# Patient Record
Sex: Female | Born: 1935
Health system: Southern US, Community
[De-identification: ages and names within clinical notes are randomized; demographics above are authoritative.]

## PROBLEM LIST (undated history)

## (undated) DIAGNOSIS — I10 Essential (primary) hypertension: Secondary | ICD-10-CM

## (undated) DIAGNOSIS — M8580 Other specified disorders of bone density and structure, unspecified site: Secondary | ICD-10-CM

## (undated) DIAGNOSIS — Z780 Asymptomatic menopausal state: Secondary | ICD-10-CM

## (undated) DIAGNOSIS — E785 Hyperlipidemia, unspecified: Secondary | ICD-10-CM

## (undated) DIAGNOSIS — Z83719 Family history of colon polyps, unspecified: Secondary | ICD-10-CM

## (undated) DIAGNOSIS — Z8371 Family history of colonic polyps: Secondary | ICD-10-CM

## (undated) HISTORY — PX: ABDOMINAL HYSTERECTOMY: SHX81

## (undated) HISTORY — DX: Asymptomatic menopausal state: Z78.0

## (undated) HISTORY — DX: Other specified disorders of bone density and structure, unspecified site: M85.80

## (undated) HISTORY — PX: CHOLECYSTECTOMY: SHX55

## (undated) HISTORY — DX: Family history of colonic polyps: Z83.71

## (undated) HISTORY — DX: Hyperlipidemia, unspecified: E78.5

## (undated) HISTORY — DX: Family history of colon polyps, unspecified: Z83.719

---

## 1998-02-15 ENCOUNTER — Ambulatory Visit (HOSPITAL_COMMUNITY): Admission: RE | Admit: 1998-02-15 | Discharge: 1998-02-15 | Payer: Self-pay

## 1999-03-14 ENCOUNTER — Ambulatory Visit (HOSPITAL_COMMUNITY): Admission: RE | Admit: 1999-03-14 | Discharge: 1999-03-14 | Payer: Self-pay | Admitting: *Deleted

## 2000-02-28 ENCOUNTER — Ambulatory Visit (HOSPITAL_COMMUNITY): Admission: RE | Admit: 2000-02-28 | Discharge: 2000-02-28 | Payer: Self-pay | Admitting: Sports Medicine

## 2001-05-07 ENCOUNTER — Encounter: Payer: Self-pay | Admitting: Internal Medicine

## 2001-05-07 ENCOUNTER — Ambulatory Visit (HOSPITAL_COMMUNITY): Admission: RE | Admit: 2001-05-07 | Discharge: 2001-05-07 | Payer: Self-pay | Admitting: Internal Medicine

## 2002-08-25 ENCOUNTER — Encounter: Payer: Self-pay | Admitting: Internal Medicine

## 2002-08-25 ENCOUNTER — Encounter: Admission: RE | Admit: 2002-08-25 | Discharge: 2002-08-25 | Payer: Self-pay | Admitting: Internal Medicine

## 2003-11-17 ENCOUNTER — Ambulatory Visit (HOSPITAL_COMMUNITY): Admission: RE | Admit: 2003-11-17 | Discharge: 2003-11-17 | Payer: Self-pay | Admitting: Internal Medicine

## 2003-11-30 ENCOUNTER — Encounter: Payer: Self-pay | Admitting: Internal Medicine

## 2003-12-30 ENCOUNTER — Emergency Department (HOSPITAL_COMMUNITY): Admission: EM | Admit: 2003-12-30 | Discharge: 2003-12-30 | Payer: Self-pay | Admitting: Emergency Medicine

## 2004-01-30 ENCOUNTER — Encounter (INDEPENDENT_AMBULATORY_CARE_PROVIDER_SITE_OTHER): Payer: Self-pay | Admitting: *Deleted

## 2004-01-30 ENCOUNTER — Observation Stay (HOSPITAL_COMMUNITY): Admission: RE | Admit: 2004-01-30 | Discharge: 2004-01-31 | Payer: Self-pay | Admitting: General Surgery

## 2004-05-02 ENCOUNTER — Ambulatory Visit: Payer: Self-pay | Admitting: Internal Medicine

## 2004-05-11 ENCOUNTER — Ambulatory Visit: Payer: Self-pay | Admitting: Internal Medicine

## 2004-07-02 ENCOUNTER — Ambulatory Visit: Payer: Self-pay | Admitting: Internal Medicine

## 2004-07-05 ENCOUNTER — Encounter: Admission: RE | Admit: 2004-07-05 | Discharge: 2004-07-05 | Payer: Self-pay | Admitting: Internal Medicine

## 2004-07-19 ENCOUNTER — Ambulatory Visit: Payer: Self-pay | Admitting: Internal Medicine

## 2004-07-20 ENCOUNTER — Ambulatory Visit: Payer: Self-pay | Admitting: Internal Medicine

## 2004-07-27 ENCOUNTER — Encounter: Admission: RE | Admit: 2004-07-27 | Discharge: 2004-07-27 | Payer: Self-pay | Admitting: Internal Medicine

## 2004-08-31 ENCOUNTER — Ambulatory Visit: Payer: Self-pay | Admitting: Internal Medicine

## 2004-09-05 ENCOUNTER — Ambulatory Visit: Payer: Self-pay | Admitting: Internal Medicine

## 2005-02-13 ENCOUNTER — Ambulatory Visit: Payer: Self-pay | Admitting: Internal Medicine

## 2005-05-10 ENCOUNTER — Ambulatory Visit (HOSPITAL_COMMUNITY): Admission: RE | Admit: 2005-05-10 | Discharge: 2005-05-10 | Payer: Self-pay | Admitting: Internal Medicine

## 2005-09-03 ENCOUNTER — Ambulatory Visit: Payer: Self-pay | Admitting: Internal Medicine

## 2005-09-06 ENCOUNTER — Ambulatory Visit: Payer: Self-pay | Admitting: Internal Medicine

## 2006-01-07 ENCOUNTER — Ambulatory Visit: Payer: Self-pay | Admitting: Internal Medicine

## 2006-01-23 ENCOUNTER — Encounter: Admission: RE | Admit: 2006-01-23 | Discharge: 2006-01-23 | Payer: Self-pay | Admitting: Surgery

## 2007-01-14 ENCOUNTER — Ambulatory Visit: Payer: Self-pay | Admitting: Internal Medicine

## 2007-01-14 DIAGNOSIS — D126 Benign neoplasm of colon, unspecified: Secondary | ICD-10-CM

## 2007-01-14 DIAGNOSIS — E785 Hyperlipidemia, unspecified: Secondary | ICD-10-CM

## 2007-01-14 DIAGNOSIS — E8941 Symptomatic postprocedural ovarian failure: Secondary | ICD-10-CM

## 2007-01-14 DIAGNOSIS — M8580 Other specified disorders of bone density and structure, unspecified site: Secondary | ICD-10-CM

## 2007-01-27 ENCOUNTER — Ambulatory Visit: Payer: Self-pay | Admitting: Internal Medicine

## 2007-01-29 LAB — CONVERTED CEMR LAB
ALT: 22 units/L (ref 0–35)
AST: 22 units/L (ref 0–37)
BUN: 11 mg/dL (ref 6–23)
Basophils Absolute: 0.1 10*3/uL (ref 0.0–0.1)
Basophils Relative: 1 % (ref 0.0–1.0)
CO2: 28 meq/L (ref 19–32)
Calcium: 9.2 mg/dL (ref 8.4–10.5)
Creatinine, Ser: 0.7 mg/dL (ref 0.4–1.2)
Hemoglobin: 13.6 g/dL (ref 12.0–15.0)
MCHC: 34.2 g/dL (ref 30.0–36.0)
Monocytes Absolute: 0.5 10*3/uL (ref 0.2–0.7)
Monocytes Relative: 6.7 % (ref 3.0–11.0)
Platelets: 271 10*3/uL (ref 150–400)
Potassium: 4.4 meq/L (ref 3.5–5.1)
RBC: 4.11 M/uL (ref 3.87–5.11)
RDW: 12.6 % (ref 11.5–14.6)
TSH: 1.86 microintl units/mL (ref 0.35–5.50)

## 2007-01-30 ENCOUNTER — Ambulatory Visit: Payer: Self-pay | Admitting: Internal Medicine

## 2007-01-30 ENCOUNTER — Encounter: Payer: Self-pay | Admitting: Internal Medicine

## 2007-02-02 ENCOUNTER — Encounter: Admission: RE | Admit: 2007-02-02 | Discharge: 2007-02-02 | Payer: Self-pay | Admitting: Internal Medicine

## 2007-02-11 ENCOUNTER — Encounter (INDEPENDENT_AMBULATORY_CARE_PROVIDER_SITE_OTHER): Payer: Self-pay | Admitting: *Deleted

## 2007-03-10 ENCOUNTER — Telehealth: Payer: Self-pay | Admitting: Internal Medicine

## 2007-06-05 ENCOUNTER — Ambulatory Visit: Payer: Self-pay | Admitting: Internal Medicine

## 2007-07-07 ENCOUNTER — Encounter: Payer: Self-pay | Admitting: Internal Medicine

## 2007-07-07 ENCOUNTER — Ambulatory Visit: Payer: Self-pay | Admitting: Internal Medicine

## 2007-07-07 ENCOUNTER — Other Ambulatory Visit: Admission: RE | Admit: 2007-07-07 | Discharge: 2007-07-07 | Payer: Self-pay | Admitting: Internal Medicine

## 2007-07-09 ENCOUNTER — Ambulatory Visit: Payer: Self-pay | Admitting: Internal Medicine

## 2007-07-09 ENCOUNTER — Telehealth (INDEPENDENT_AMBULATORY_CARE_PROVIDER_SITE_OTHER): Payer: Self-pay | Admitting: *Deleted

## 2007-07-17 ENCOUNTER — Encounter: Payer: Self-pay | Admitting: Internal Medicine

## 2007-07-17 LAB — CONVERTED CEMR LAB
Pap Smear: NORMAL
Total CHOL/HDL Ratio: 2.8
Triglycerides: 88 mg/dL (ref 0–149)

## 2007-12-09 ENCOUNTER — Ambulatory Visit: Payer: Self-pay | Admitting: Internal Medicine

## 2008-02-15 ENCOUNTER — Ambulatory Visit: Payer: Self-pay | Admitting: Internal Medicine

## 2008-02-15 DIAGNOSIS — M549 Dorsalgia, unspecified: Secondary | ICD-10-CM | POA: Insufficient documentation

## 2008-02-17 ENCOUNTER — Ambulatory Visit: Payer: Self-pay | Admitting: Internal Medicine

## 2008-02-19 ENCOUNTER — Telehealth (INDEPENDENT_AMBULATORY_CARE_PROVIDER_SITE_OTHER): Payer: Self-pay | Admitting: *Deleted

## 2008-02-19 LAB — CONVERTED CEMR LAB
AST: 25 units/L (ref 0–37)
Albumin: 3.7 g/dL (ref 3.5–5.2)
Bilirubin, Direct: 0.1 mg/dL (ref 0.0–0.3)
CO2: 28 meq/L (ref 19–32)
Chloride: 101 meq/L (ref 96–112)
Creatinine, Ser: 0.9 mg/dL (ref 0.4–1.2)
Glucose, Bld: 83 mg/dL (ref 70–99)
Sodium: 136 meq/L (ref 135–145)
Total Bilirubin: 0.8 mg/dL (ref 0.3–1.2)

## 2008-04-28 ENCOUNTER — Ambulatory Visit: Payer: Self-pay | Admitting: Diagnostic Radiology

## 2008-04-28 ENCOUNTER — Ambulatory Visit (HOSPITAL_BASED_OUTPATIENT_CLINIC_OR_DEPARTMENT_OTHER): Admission: RE | Admit: 2008-04-28 | Discharge: 2008-04-28 | Payer: Self-pay | Admitting: Internal Medicine

## 2008-05-03 ENCOUNTER — Encounter (INDEPENDENT_AMBULATORY_CARE_PROVIDER_SITE_OTHER): Payer: Self-pay | Admitting: *Deleted

## 2008-10-25 ENCOUNTER — Encounter (INDEPENDENT_AMBULATORY_CARE_PROVIDER_SITE_OTHER): Payer: Self-pay | Admitting: *Deleted

## 2008-10-27 ENCOUNTER — Telehealth: Payer: Self-pay | Admitting: Internal Medicine

## 2008-11-08 ENCOUNTER — Encounter: Payer: Self-pay | Admitting: Internal Medicine

## 2009-02-27 ENCOUNTER — Ambulatory Visit: Payer: Self-pay | Admitting: Internal Medicine

## 2009-03-03 ENCOUNTER — Encounter (INDEPENDENT_AMBULATORY_CARE_PROVIDER_SITE_OTHER): Payer: Self-pay | Admitting: *Deleted

## 2009-03-03 ENCOUNTER — Encounter: Payer: Self-pay | Admitting: Internal Medicine

## 2009-03-03 LAB — CONVERTED CEMR LAB
ALT: 22 units/L (ref 0–35)
AST: 24 units/L (ref 0–37)
Basophils Relative: 0.1 % (ref 0.0–3.0)
CO2: 32 meq/L (ref 19–32)
Calcium: 9 mg/dL (ref 8.4–10.5)
Cholesterol: 181 mg/dL (ref 0–200)
Creatinine, Ser: 0.6 mg/dL (ref 0.4–1.2)
GFR calc non Af Amer: 103.04 mL/min (ref >60)
HDL: 68.3 mg/dL (ref >39.00)
Hemoglobin: 13.2 g/dL (ref 12.0–15.0)
LDL Cholesterol: 98 mg/dL (ref 0–99)
Lymphocytes Relative: 36.4 % (ref 12.0–46.0)
Monocytes Relative: 7.7 % (ref 3.0–12.0)
Neutro Abs: 4.3 10*3/uL (ref 1.4–7.7)
Neutrophils Relative %: 52.6 % (ref 43.0–77.0)
RBC: 4 M/uL (ref 3.87–5.11)
Sodium: 141 meq/L (ref 135–145)
Total CHOL/HDL Ratio: 3
Triglycerides: 73 mg/dL (ref 0.0–149.0)
WBC: 8.2 10*3/uL (ref 4.5–10.5)

## 2009-04-10 ENCOUNTER — Telehealth: Payer: Self-pay | Admitting: Internal Medicine

## 2009-08-31 ENCOUNTER — Telehealth (INDEPENDENT_AMBULATORY_CARE_PROVIDER_SITE_OTHER): Payer: Self-pay | Admitting: *Deleted

## 2009-10-27 ENCOUNTER — Telehealth (INDEPENDENT_AMBULATORY_CARE_PROVIDER_SITE_OTHER): Payer: Self-pay | Admitting: *Deleted

## 2009-10-31 ENCOUNTER — Ambulatory Visit: Payer: Self-pay | Admitting: Internal Medicine

## 2009-10-31 ENCOUNTER — Inpatient Hospital Stay (HOSPITAL_COMMUNITY): Admission: AD | Admit: 2009-10-31 | Discharge: 2009-11-02 | Payer: Self-pay | Admitting: Cardiology

## 2009-10-31 ENCOUNTER — Ambulatory Visit: Payer: Self-pay | Admitting: Cardiology

## 2009-11-03 ENCOUNTER — Telehealth: Payer: Self-pay | Admitting: Internal Medicine

## 2009-11-10 ENCOUNTER — Ambulatory Visit: Payer: Self-pay | Admitting: Internal Medicine

## 2009-11-10 DIAGNOSIS — F411 Generalized anxiety disorder: Secondary | ICD-10-CM

## 2009-11-10 DIAGNOSIS — R0902 Hypoxemia: Secondary | ICD-10-CM | POA: Insufficient documentation

## 2009-11-13 ENCOUNTER — Ambulatory Visit: Payer: Self-pay | Admitting: Cardiology

## 2009-11-13 DIAGNOSIS — I10 Essential (primary) hypertension: Secondary | ICD-10-CM

## 2009-11-13 DIAGNOSIS — G4730 Sleep apnea, unspecified: Secondary | ICD-10-CM | POA: Insufficient documentation

## 2009-12-05 ENCOUNTER — Ambulatory Visit: Payer: Self-pay | Admitting: Internal Medicine

## 2009-12-11 LAB — CONVERTED CEMR LAB
AST: 19 units/L (ref 0–37)
HDL: 57.4 mg/dL (ref >39.00)
LDL Cholesterol: 103 mg/dL — ABNORMAL HIGH (ref 0–99)
Total CHOL/HDL Ratio: 3
Triglycerides: 78 mg/dL (ref 0.0–149.0)

## 2009-12-13 ENCOUNTER — Encounter: Payer: Self-pay | Admitting: Internal Medicine

## 2009-12-14 ENCOUNTER — Encounter: Payer: Self-pay | Admitting: Internal Medicine

## 2009-12-27 ENCOUNTER — Encounter: Payer: Self-pay | Admitting: Internal Medicine

## 2010-01-05 ENCOUNTER — Encounter: Payer: Self-pay | Admitting: Internal Medicine

## 2010-01-05 ENCOUNTER — Ambulatory Visit: Payer: Self-pay | Admitting: Internal Medicine

## 2010-01-08 ENCOUNTER — Ambulatory Visit: Payer: Self-pay | Admitting: Internal Medicine

## 2010-01-08 ENCOUNTER — Encounter (INDEPENDENT_AMBULATORY_CARE_PROVIDER_SITE_OTHER): Payer: Self-pay | Admitting: *Deleted

## 2010-01-10 ENCOUNTER — Ambulatory Visit: Payer: Self-pay | Admitting: Radiology

## 2010-01-10 ENCOUNTER — Ambulatory Visit (HOSPITAL_BASED_OUTPATIENT_CLINIC_OR_DEPARTMENT_OTHER): Admission: RE | Admit: 2010-01-10 | Discharge: 2010-01-10 | Payer: Self-pay | Admitting: Internal Medicine

## 2010-01-11 ENCOUNTER — Telehealth: Payer: Self-pay | Admitting: Internal Medicine

## 2010-01-16 ENCOUNTER — Ambulatory Visit: Payer: Self-pay | Admitting: Pulmonary Disease

## 2010-01-17 ENCOUNTER — Encounter: Payer: Self-pay | Admitting: Internal Medicine

## 2010-02-14 ENCOUNTER — Encounter (INDEPENDENT_AMBULATORY_CARE_PROVIDER_SITE_OTHER): Payer: Self-pay | Admitting: *Deleted

## 2010-02-15 ENCOUNTER — Ambulatory Visit: Payer: Self-pay | Admitting: Gastroenterology

## 2010-02-23 ENCOUNTER — Encounter: Payer: Self-pay | Admitting: Gastroenterology

## 2010-02-27 ENCOUNTER — Ambulatory Visit: Payer: Self-pay | Admitting: Internal Medicine

## 2010-02-27 DIAGNOSIS — R05 Cough: Secondary | ICD-10-CM

## 2010-02-28 ENCOUNTER — Ambulatory Visit: Payer: Self-pay | Admitting: Gastroenterology

## 2010-03-01 ENCOUNTER — Telehealth: Payer: Self-pay | Admitting: Internal Medicine

## 2010-03-06 ENCOUNTER — Encounter: Payer: Self-pay | Admitting: Internal Medicine

## 2010-03-06 ENCOUNTER — Telehealth: Payer: Self-pay | Admitting: Internal Medicine

## 2010-03-06 ENCOUNTER — Ambulatory Visit: Payer: Self-pay | Admitting: Internal Medicine

## 2010-03-06 LAB — CONVERTED CEMR LAB
ALT: 13 units/L (ref 0–35)
BUN: 13 mg/dL (ref 6–23)
Calcium: 9.5 mg/dL (ref 8.4–10.5)
GFR calc non Af Amer: 95.39 mL/min (ref >60.00)
Glucose, Bld: 106 mg/dL — ABNORMAL HIGH (ref 70–99)
Potassium: 4.2 meq/L (ref 3.5–5.1)
Sodium: 136 meq/L (ref 135–145)

## 2010-03-08 LAB — CONVERTED CEMR LAB: Vit D, 25-Hydroxy: 37 ng/mL (ref 30–89)

## 2010-03-14 ENCOUNTER — Telehealth: Payer: Self-pay | Admitting: Internal Medicine

## 2010-04-12 ENCOUNTER — Encounter: Payer: Self-pay | Admitting: Gastroenterology

## 2010-04-17 NOTE — Progress Notes (Signed)
Summary: Schedule Colonoscopy  Phone Note Outgoing Call   Call placed by: Hortense Ramal CMA Duncan Dull),  April 10, 2009 12:06 PM Call placed to: Patient Summary of Call: I have called patient to advise her that it is time for her recall colonoscopy due to her history of adenomatous colonic polyps. Patient states that she got the letter but is uninterested in having a colonoscopy at this time. Initial call taken by: Hortense Ramal CMA Duncan Dull),  April 10, 2009 12:07 PM

## 2010-04-17 NOTE — Assessment & Plan Note (Signed)
Summary: PAIN IN FEET/NEEDS REFERRAL/KN   Vital Signs:  Patient profile:   77 year old female Weight:      152.13 pounds Pulse rate:   71 / minute Pulse rhythm:   regular BP sitting:   126 / 86  (left arm) Cuff size:   large  Vitals Entered By: Army Fossa CMA (January 08, 2010 3:08 PM) CC: Pt here c/o pain in the tops of both feet.  Comments getting worse overtime. stopped all meds today- feels it has something to do with pain. pharm- [facility]   History of Present Illness: L>R foot pain; pain  located at the dorsum of the food, much worse with walking and putting pressure on it. she is holding lipitor and  Toprol for the last 2 days. No difference in the pain  so far.  ROS No actual injury Note exercising more, rather she has been more sedentary No pain in the heels No swelling in that area No other joints affected  Current Medications (verified): 1)  Baby Aspirin 81 Mg  Chew (Aspirin) .Marland Kitchen.. 1 By Mouth Qd 2)  Cod Liver Oil  Oil (Cod Liver Oil) .... Take 1 Capsule By Mouth Once A Day 3)  Flax Seed Oil 1000 Mg Caps (Flaxseed (Linseed)) .... Take 1 Capsule By Mouth Once A Day 4)  Vitamin D 1000 Unit Tabs (Cholecalciferol) .... Take 1 Tablet By Mouth Once A Day 5)  Lipitor 20 Mg Tabs (Atorvastatin Calcium) .... Take One Tablet By Mouth Daily. 6)  Metoprolol Succinate 25 Mg Xr24h-Tab (Metoprolol Succinate) .... Take One Tablet By Mouth Daily  Allergies (verified): 1)  ! * Vytorin  Physical Exam  General:  alert, well-developed, and well-nourished.   Pulses:  good bilateral pedal pulses Extremities:  no peripheral edema, calves symmetric and nontender Palpation of the dorsum of the left foot slightly warm, tender and  puffy but no red, no deformity  Dorsum of the right foot   essentially normal except for some tenderness to palpation Ankle: No edema, redness or decreased range of motion on either side. Toes with good capillary refill and no swelling Psych:  not anxious  appearing and not depressed appearing.     Impression & Recommendations:  Problem # 1:  FOOT PAIN, BILATERAL (ICD-729.5) presented w/  pain bilaterally, worse on the left. There is no indication of podagra- gout  at this point Stress fracture? plan: Rest, x-rays, pain control with Tylenol, okay to hold lipitor for 2 weeks although is unlikely to be the culprit. Continue with Toprol  Orders: T-Foot Left Min 3 Views (73630TC) T-Foot Right (73630TC)  Complete Medication List: 1)  Baby Aspirin 81 Mg Chew (Aspirin) .Marland Kitchen.. 1 by mouth qd 2)  Cod Liver Oil Oil (Cod liver oil) .... Take 1 capsule by mouth once a day 3)  Flax Seed Oil 1000 Mg Caps (Flaxseed (linseed)) .... Take 1 capsule by mouth once a day 4)  Vitamin D 1000 Unit Tabs (Cholecalciferol) .... Take 1 tablet by mouth once a day 5)  Lipitor 20 Mg Tabs (Atorvastatin calcium) .... Take one tablet by mouth daily. 6)  Metoprolol Succinate 25 Mg Xr24h-tab (Metoprolol succinate) .... Take one tablet by mouth daily  Patient Instructions: 1)  hold Lipitor for 2 weeks 2)  Tylenol 500 mg over-the-counter, one or 2 tablets every 6 hours as needed for pain 3)  call in one week and let me know if you are not better   Orders Added: 1)  T-Foot Left Min  3 Views [73630TC] 2)  T-Foot Right [73630TC] 3)  Est. Patient Level III [04540]

## 2010-04-17 NOTE — Miscellaneous (Signed)
Summary: Order for Oxygen/[facility]  Order for Oxygen/[facility]   Imported By: Lanelle Bal 01/24/2010 08:46:19  _____________________________________________________________________  External Attachment:    Type:   Image     Comment:   External Document

## 2010-04-17 NOTE — Letter (Signed)
Summary: [facility] Instructions  [facility]  [address] [place], [place] [address]   Phone: [phone]  Fax: [phone]       Alicia Joseph    10-18-1929    MRN: [id number]        Procedure Day Alicia Joseph:  [facility]  02/28/10     Arrival Time:  9:00AM     Procedure Time:  10:00AM     Location of Procedure:                    Alicia Joseph _  [facility] (4th Floor)                      PREPARATION FOR COLONOSCOPY WITH MOVIPREP   Starting 5 days prior to your procedure 02/23/10 do not eat nuts, seeds, popcorn, corn, beans, peas,  salads, or any raw vegetables.  Do not take any fiber supplements (e.g. Metamucil, Citrucel, and Benefiber).  THE DAY BEFORE YOUR PROCEDURE         DATE: 02/27/10  DAY: TUESDAY  1.  Drink clear liquids the entire day-NO SOLID FOOD  2.  Do not drink anything colored red or purple.  Avoid juices with pulp.  No orange juice.  3.  Drink at least 64 oz. (8 glasses) of fluid/clear liquids during the day to prevent dehydration and help the prep work efficiently.  CLEAR LIQUIDS INCLUDE: Water Jello Ice Popsicles Tea (sugar ok, no milk/cream) Powdered fruit flavored drinks Coffee (sugar ok, no milk/cream) Gatorade Juice: apple, white grape, white cranberry  Lemonade Clear bullion, consomm, broth Carbonated beverages (any kind) Strained chicken noodle soup Hard Candy                             4.  In the morning, mix first dose of MoviPrep solution:    Empty 1 Pouch A and 1 Pouch B into the disposable container    Add lukewarm drinking water to the top line of the container. Mix to dissolve    Refrigerate (mixed solution should be used within 24 hrs)  5.  Begin drinking the prep at 5:00 p.m. The MoviPrep container is divided by 4 marks.   Every 15 minutes drink the solution down to the next mark (approximately 8 oz) until the full liter is complete.   6.  Follow completed prep with 16 oz of clear liquid of your choice (Nothing  red or purple).  Continue to drink clear liquids until bedtime.  7.  Before going to bed, mix second dose of MoviPrep solution:    Empty 1 Pouch A and 1 Pouch B into the disposable container    Add lukewarm drinking water to the top line of the container. Mix to dissolve    Refrigerate  THE DAY OF YOUR PROCEDURE      DATE: 02/28/10  DAY: WEDNESDAY  Beginning at 5:00AM (5 hours before procedure):         1. Every 15 minutes, drink the solution down to the next mark (approx 8 oz) until the full liter is complete.  2. Follow completed prep with 16 oz. of clear liquid of your choice.    3. You may drink clear liquids until 8:00AM (2 HOURS BEFORE PROCEDURE).   MEDICATION INSTRUCTIONS  Unless otherwise instructed, you should take regular prescription medications with a small sip of water   as early as possible the morning of your  procedure.         OTHER INSTRUCTIONS  You will need a responsible adult at least 75 years of age to accompany you and drive you home.   This person must remain in the waiting room during your procedure.  Wear loose fitting clothing that is easily removed.  Leave jewelry and other valuables at home.  However, you may wish to bring a book to read or  an iPod/MP3 player to listen to music as you wait for your procedure to start.  Remove all body piercing jewelry and leave at home.  Total time from sign-in until discharge is approximately 2-3 hours.  You should go home directly after your procedure and rest.  You can resume normal activities the  day after your procedure.  The day of your procedure you should not:   Drive   Make legal decisions   Operate machinery   Drink alcohol   Return to work  You will receive specific instructions about eating, activities and medications before you leave.    The above instructions have been reviewed and explained to me by   Alicia Almas RN  February 15, 2010 3:07 PM     I fully understand  and can verbalize these instructions _____________________________ Date _________

## 2010-04-17 NOTE — Assessment & Plan Note (Signed)
Summary: Alicia   Visit Type:  Post-hospital  CC:  No complains.  History of Present Illness: Alicia Joseph was the first time she felt like herself.  SHe has not had any more chest pain.  She was Dr. Drue Novel on Friday.    Current Medications (verified): 1)  Baby Aspirin 81 Mg  Chew (Aspirin) .Marland Kitchen.. 1 By Mouth Qd 2)  Cod Liver Oil  Oil (Cod Liver Oil) .... Take 1 Capsule By Mouth Once A Day 3)  Flax Seed Oil 1000 Mg Caps (Flaxseed (Linseed)) .... Take 1 Capsule By Mouth Once A Day 4)  Vitamin D 1000 Unit Tabs (Cholecalciferol) .... Take 1 Tablet By Mouth Once A Day 5)  Lipitor 20 Mg Tabs (Atorvastatin Calcium) .... Take One Tablet By Mouth Daily.  Allergies: 1)  ! * Vytorin  Vital Signs:  Patient profile:   77 year old female Height:      64 inches Weight:      151.75 pounds BMI:     26.14 Pulse rate:   74 / minute Pulse rhythm:   regular Resp:     18 per minute BP sitting:   170 / 80  (left arm) Cuff size:   large  Vitals Entered By: Vikki Ports (November 13, 2009 3:30 PM)  Physical Exam  General:  Well developed, well nourished, in no acute distress. Head:  normocephalic and atraumatic Eyes:  PERRLA/EOM intact; conjunctiva and lids normal. Lungs:  Clear bilaterally to auscultation and percussion. Heart:  PMI non displaced.  Normal S1 and S2.  No murmur.  Extremities:  No clubbing or cyanosis. Neurologic:  Alert and oriented x 3.   EKG  Procedure date:  11/13/2009  Findings:      NSR.  WNL.  Impression & Recommendations:  Problem # 1:  CHEST PAIN (ICD-786.50) Resolved.  No further episodes.  Did relatively well on study, and I have encouraged her to walk for two weeks before undertaking any further activity such as tennis, and getting approval from Dr. Drue Novel before doing so.   Her updated medication list for this problem includes:    Baby Aspirin 81 Mg Chew (Aspirin) .Marland Kitchen... 1 by mouth qd    Metoprolol Succinate 25 Mg Xr24h-tab (Metoprolol succinate) .Marland Kitchen... Take one tablet by  mouth daily  Orders: EKG w/ Interpretation (93000)  Problem # 2:  HYPERTENSION, BENIGN (ICD-401.1) a bit episodic, but she needs to get a log of BP at home, and then take that to Dr. Drue Novel.  If they hover in the higher range, then some initiation of therapy would be helpful. Her updated medication list for this problem includes:    Baby Aspirin 81 Mg Chew (Aspirin) .Marland Kitchen... 1 by mouth qd    Metoprolol Succinate 25 Mg Xr24h-tab (Metoprolol succinate) .Marland Kitchen... Take one tablet by mouth daily  Problem # 3:  UNSPECIFIED SLEEP APNEA (ICD-780.57) She develop some hypoxemia in hospital at night, it was brought to her attention.  Would benefit from sleep assessment.  Will defer to Dr. Drue Novel.    Patient Instructions: 1)  Your physician recommends that you schedule a follow-up appointment as needed with Dr Riley Kill.  Please schedule an appointment with Dr Drue Novel in 4 WEEKS. 2)  Your physician has requested that you regularly monitor and record your blood pressure and pulse readings at home.  Please use the same machine at the same time of day to check your readings and record them to bring to your follow-up visit.  Please call our office in  a few weeks with your BP and pulse readings.  3)  Your physician has recommended you make the following change in your medication: START Metoprolol Succinate 25mg  once a day  Prescriptions: METOPROLOL SUCCINATE 25 MG XR24H-TAB (METOPROLOL SUCCINATE) Take one tablet by mouth daily  #90 x 2   Entered by:   Julieta Gutting, RN, BSN   Authorized by:   Ronaldo Miyamoto, MD, [facility]   Signed by:   Julieta Gutting, RN, BSN on 11/13/2009   Method used:   Electronically to        [facility] [id number] (retail)       [address] [place], [place]  [address]       Ph: [phone]       Fax: [phone]   RxID:   [id number]

## 2010-04-17 NOTE — Procedures (Signed)
Summary: Oximetry/[facility]  Oximetry/[facility]   Imported By: Lanelle Bal 01/10/2010 09:21:54  _____________________________________________________________________  External Attachment:    Type:   Image     Comment:   External Document

## 2010-04-17 NOTE — Progress Notes (Signed)
Summary: Xray Reports  Phone Note Call from Patient Call back at Home Phone [phone]   Caller: Patient Summary of Call: Pt would like to know the results from her Xray Reports.  Initial call taken by: Army Fossa CMA,  January 11, 2010 4:13 PM  Follow-up for Phone Call        x-rays negative If pain persists---> rest, leg elevation and arrange a orthopedic surgery consultation Follow-up by: [facility] E. Paz MD,  January 12, 2010 10:06 AM  Additional Follow-up for Phone Call Additional follow up Details #1::        I spoke with pt she is aware.  Additional Follow-up by: Army Fossa CMA,  January 12, 2010 11:14 AM

## 2010-04-17 NOTE — Letter (Signed)
Summary: Pre Visit Letter Revised  [facility]  [address] [place], [place] [address]   Phone: [phone]  Fax: [phone]        01/08/2010 MRN: [id number] Alicia Joseph [address] [place], [place]  [address]             Procedure Date:  01/30/2010   Welcome to the Gastroenterology Division at [facility].    You are scheduled to see a nurse for your pre-procedure visit on 01/17/2010 at 4:00PM on the 3rd floor at [facility], [address].  We ask that you try to arrive at our office 15 minutes prior to your appointment time to allow for check-in.  Please take a minute to review the attached form.  If you answer "Yes" to one or more of the questions on the first page, we ask that you call the person listed at your earliest opportunity.  If you answer "No" to all of the questions, please complete the rest of the form and bring it to your appointment.    Your nurse visit will consist of discussing your medical and surgical history, your immediate family medical history, and your medications.   If you are unable to list all of your medications on the form, please bring the medication bottles to your appointment and we will list them.  We will need to be aware of both prescribed and over the counter drugs.  We will need to know exact dosage information as well.    Please be prepared to read and sign documents such as consent forms, a financial agreement, and acknowledgement forms.  If necessary, and with your consent, a friend or relative is welcome to sit-in on the nurse visit with you.  Please bring your insurance card so that we may make a copy of it.  If your insurance requires a referral to see a specialist, please bring your referral form from your primary care physician.  No co-pay is required for this nurse visit.     If you cannot keep your appointment, please call [phone] to cancel or reschedule prior to your appointment date.  This allows  [place] the opportunity to schedule an appointment for another patient in need of care.    Thank you for choosing  Gastroenterology for your medical needs.  We appreciate the opportunity to care for you.  Please visit [place] at our website  to learn more about our practice.  Sincerely, The Gastroenterology Division

## 2010-04-17 NOTE — Assessment & Plan Note (Signed)
Summary: FOLLOW UP FROM CARDIOLOGIST/KN   Vital Signs:  Patient profile:   77 year old female Weight:      149.38 pounds Pulse rate:   66 / minute Pulse rhythm:   regular BP sitting:   139 / 82  (left arm) Cuff size:   large  Vitals Entered By: Army Fossa CMA (December 05, 2009 9:09 AM) CC: Pt here for f/u from Cardiologist.  Comments Pharm- [facility]  Had flu shot @ [facility].    History of Present Illness: routine office visit doing well note from cardiology reviewed, they recommended  to start walking on possibly restart playing tennis  ROS denies further chest pain She is able to walk up to 30 minutes without chest pain or difficulty breathing although states that at rest sometimes feel short of breath. Symptoms are mild. She was supposed to see pulmonary for evaluation of transient hypoxemia in the hospital, she canceled that appointment primarily because she feels that she is okay and beliefs she will be fine by  going back to exercise and get in better shape. No cough or wheezing  Current Medications (verified): 1)  Baby Aspirin 81 Mg  Chew (Aspirin) .Marland Kitchen.. 1 By Mouth Qd 2)  Cod Liver Oil  Oil (Cod Liver Oil) .... Take 1 Capsule By Mouth Once A Day 3)  Flax Seed Oil 1000 Mg Caps (Flaxseed (Linseed)) .... Take 1 Capsule By Mouth Once A Day 4)  Vitamin D 1000 Unit Tabs (Cholecalciferol) .... Take 1 Tablet By Mouth Once A Day 5)  Lipitor 20 Mg Tabs (Atorvastatin Calcium) .... Take One Tablet By Mouth Daily. 6)  Metoprolol Succinate 25 Mg Xr24h-Tab (Metoprolol Succinate) .... Take One Tablet By Mouth Daily  Allergies: 1)  ! * Vytorin  Past History:  Past Medical History: Reviewed history from 11/10/2009 and no changes required. chest pain, negative stress test, 10-2009 Osteopenia Hyperlipidemia MENOPAUSE, SURGICAL  Hx of COLONIC POLYPS    Past Surgical History: Reviewed history from 11/09/2009 and no changes required. Cholecystectomy Hysterectomy,  B  Oophorectomy  Social History: Reviewed history from 11/10/2009 and no changes required. 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   Lungs:  normal respiratory effort, no intercostal retractions, no accessory muscle use, and normal breath sounds.   Heart:  normal rate, regular rhythm, no murmur, and no gallop.   Extremities:  no edema   Impression & Recommendations:  Problem # 1:  HYPERTENSION, BENIGN (ICD-401.1) stable Her updated medication list for this problem includes:    Metoprolol Succinate 25 Mg Xr24h-tab (Metoprolol succinate) .Marland Kitchen... Take one tablet by mouth daily  BP today: 139/82 Prior BP: 170/80 (11/13/2009)  Labs Reviewed: K+: 3.6 (02/27/2009) Creat: : 0.6 (02/27/2009)   Chol: 181 (02/27/2009)   HDL: 68.30 (02/27/2009)   LDL: 98 (02/27/2009)   TG: 73.0 (02/27/2009)  Problem # 2:  HYPOXEMIA (ICD-799.02)  transient hypoxia in the hospital She canceled her  pulmonary appointment O2 Sat at rest and after excision today: 95% at rest, 92% after exertion the patient has occasional shortness or breath at rest, however she is able to walk 30 minutes without problems. I would recommend her to go back to play tennis, I'm afraid she will deconditioned quikly if she doesn't plan: Will arrange for a nocturnal oximetry go back to tennis gradually, patient understood  Orders: Misc. Referral (Misc. Ref)  Problem # 3:  HYPERLIPIDEMIA (ICD-272.4) back on a healthy diet labs from 10-2009: cholesterol 260, HDL 58, LDL 182 plan: labs  Her updated medication list for this problem includes:    Lipitor 20 Mg Tabs (Atorvastatin calcium) .Marland Kitchen... Take one tablet by mouth daily.  Labs Reviewed: SGOT: 24 (02/27/2009)   SGPT: 22 (02/27/2009)   HDL:68.30 (02/27/2009), 68.9 (07/09/2007)  LDL:98  (02/27/2009), 110 (07/09/2007)  Chol:181 (02/27/2009), 196 (07/09/2007)  Trig:73.0 (02/27/2009), 88 (07/09/2007)  Orders: Venipuncture ([address]) TLB-Lipid Panel (80061-LIPID) TLB-AST (SGOT) (84450-SGOT) TLB-ALT (SGPT) (84460-ALT) Specimen Handling ([address])  Complete Medication List: 1)  Baby Aspirin 81 Mg Chew (Aspirin) .Marland Kitchen.. 1 by mouth qd 2)  Cod Liver Oil Oil (Cod liver oil) .... Take 1 capsule by mouth once a day 3)  Flax Seed Oil 1000 Mg Caps (Flaxseed (linseed)) .... Take 1 capsule by mouth once a day 4)  Vitamin D 1000 Unit Tabs (Cholecalciferol) .... Take 1 tablet by mouth once a day 5)  Lipitor 20 Mg Tabs (Atorvastatin calcium) .... Take one tablet by mouth daily. 6)  Metoprolol Succinate 25 Mg Xr24h-tab (Metoprolol succinate) .... Take one tablet by mouth daily  Patient Instructions: 1)  Please schedule a follow-up appointment in 3 months .    Immunization History:  Influenza Immunization History:    Influenza:  per pt [facility] (10/30/2009)

## 2010-04-17 NOTE — Miscellaneous (Signed)
Summary: BONE DENSITY  Clinical Lists Changes  Orders: Added new Test order of T-Bone Densitometry (77080) - Signed Added new Test order of T-Lumbar Vertebral Assessment (77082) - Signed 

## 2010-04-17 NOTE — Progress Notes (Signed)
Summary: GYM PAPERWORK  Phone Note Call from Patient Call back at Home Phone [phone]   Caller: Patient Summary of Call: PATIENT DROPPED OFF PAPERWORK FROM HER EXERCISE GYM PROGRAM --JUST NEEDS INFO ---NO SIGNATURE  PLEASE COMPLETE AND BRING PAPERWORK BACK TO [facility] (NO BILLING SHEET ATTACHED)  CALL [phone] WHEN READY FOR PICKUP  TOOK TO DANIELLE IN P[employer]IC [employer]   Initial call taken by: Jerolyn Shin,  October 27, 2009 2:20 PM  Follow-up for Phone Call        last labs i have are from Dec printed out and gave to Maralyn Sago. Army Fossa CMA  October 27, 2009 2:23 PM   Additional Follow-up for Phone Call Additional follow up Details #1::        CALLED AND SPOKE TO PATIENT--ADVISED HER THAT PAPERWORK HAD BEEN COMPLETED AND WAS AT FRONT DESK FOR PICKUP Additional Follow-up by: Jerolyn Shin,  October 30, 2009 9:15 AM

## 2010-04-17 NOTE — Assessment & Plan Note (Signed)
Summary: hypoxemia/cb pt aware hp   Visit Type:  Initial Consult Copy to:  pcp Primary Provider/Referring Provider:  Nolon Rod. Paz MD  CC:  Pulmonary consult.  History of Present Illness: 77/F, non smoker for evaluation of sleep related hypoxemia. She was admitted in august '11 for atypical chest pain, evaluated by dr Riley Kill, stress myoview nml, EF 72%. CXR 10/31/09 nml , mild apical scarring. found to have desaturations during sleep. Overnight oximtery on RA showed desaturation as low as 70% with satns lower than 90% about 70% of the time. Was put on nocturnal O2 - she did not report subjective improvement, occasional headaches but FU ONO showed desaturation only 7% of sleep time - hence much improved. Well's score is 0 - no risk factors for PE. She denies dyspnea, wheeze, chest pain or pedal edema - in fact is back to playing tennis , doubles & can keep up with the younger girls. She denies excessive daytime somnolence, snoring, sleeps in a different room fro her husband due to the noise of hte ocncentrator. She attributes her symptoms to 'stress' of caring for her daughter & grandkids.   Preventive Screening-Counseling & Management  Alcohol-Tobacco     Alcohol drinks/day: <1     Alcohol type: wine     Smoking Status: quit     Packs/Day: social     Year Started: 1941     Year Quit: 1954  Current Medications (verified): 1)  Baby Aspirin 81 Mg  Chew (Aspirin) .Marland Kitchen.. 1 By Mouth Qd 2)  Cod Liver Oil  Oil (Cod Liver Oil) .... Take 1 Capsule By Mouth Once A Day 3)  Flax Seed Oil 1000 Mg Caps (Flaxseed (Linseed)) .... Take 1 Capsule By Mouth Once A Day 4)  Vitamin D 1000 Unit Tabs (Cholecalciferol) .... Take 1 Tablet By Mouth Once A Day 5)  Lipitor 20 Mg Tabs (Atorvastatin Calcium) .... *******hold******take One Tablet By Mouth Daily. 6)  Metoprolol Succinate 25 Mg Xr24h-Tab (Metoprolol Succinate) .... *****hold*****take One Tablet By Mouth Daily  Allergies (verified): 1)  ! *  Vytorin  Past History:  Past Medical History: Last updated: 11/10/2009 chest pain, negative stress test, 10-2009 Osteopenia Hyperlipidemia MENOPAUSE, SURGICAL  Hx of COLONIC POLYPS    Past Surgical History: Last updated: 11/09/2009 Cholecystectomy Hysterectomy,  B Oophorectomy  Family History: Last updated: 11/09/2009 thyroid cancer--daughter Breast ca-- sister MI--no DM-- brother (deceased) colon ca--no lung ca--father  Her mother died at 75 and her father died in his 67s,   neither one with heart disease and no siblings have heart disease.      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  The patient denies shortness of breath at rest, productive cough, non-productive cough, coughing up blood, chest pain, irregular heartbeats, acid heartburn, indigestion, loss of appetite, weight change, abdominal pain, difficulty swallowing, sore throat, tooth/dental problems, sneezing, itching, ear ache, depression, rash, change in color of mucus, and fever.    Vital Signs:  Patient profile:   77 year old female Height:      64 inches Weight:      152 pounds BMI:  26.19 O2 Sat:      97 % on Room air Temp:     98.0 degrees F oral Pulse rate:   71 / minute BP sitting:   152 / 88  (left arm) Cuff size:   regular  Vitals Entered By: Zackery Barefoot CMA (January 16, 2010 10:22 AM)  O2 Flow:  Room air CC: Pulmonary consult Comments Medications reviewed with patient Verified contact number and pharmacy with patient Zackery Barefoot CMA  January 16, 2010 10:22 AM     Physical Exam  Additional Exam:  Gen. Pleasant, well-nourished, in no distress, normal affect ENT - no lesions, no post nasal drip Neck: No JVD, no thyromegaly, no carotid bruits Lungs: no use of accessory muscles, no dullness to percussion, clear without rales or rhonchi  Cardiovascular: Rhythm regular, heart sounds  normal, no murmurs or gallops, no peripheral edema Abdomen: soft and non-tender, no hepatosplenomegaly, BS normal. Musculoskeletal: No deformities, no cyanosis or clubbing Neuro:  alert, non focal     Impression & Recommendations:  Problem # 1:  HYPOXEMIA (ICD-799.02)  Sleep related hypoxemia  - is puzzling. She does not seem to have underlying significant cardiopulmonary disease - no evidenc eof fibrosis, no risk factors for pE, no overt CHF. She does not deaturate on walking Pre test probability for obstructive sleep apnea is low & even if present, she does not want to pursue CPAP therapy. O2 desaturation is not classic (sawtooth) pattern for obstructive sleep apnea. Our best option here may be to keep her on nocturnal O2 for now & revisit in 3 months - with ONO on RA  Orders: DME Referral (DME) Consultation Level III ([address])  Medications Added to Medication List This Visit: 1)  Lipitor 20 Mg Tabs (Atorvastatin calcium) .... *******hold******take one tablet by mouth daily. 2)  Metoprolol Succinate 25 Mg Xr24h-tab (Metoprolol succinate) .... *****hold*****take one tablet by mouth daily  Patient Instructions: 1)  Copy sent to: dr Drue Novel, stuckey 2)  Please schedule a follow-up appointment in 3 months. 3)  We wil check your oxygen levels during sleep WITHOUT oxygen prior to this visit 4)  Use oxygen during sleep until then  5)  OK to play tennis    Appended Document: hypoxemia/cb pt aware hp Ambulatory Pulse Oximetry  Resting; HR__72___    02 Sat__96%RA___  Lap1 (185 feet)   HR__82___   02 Sat__95%RA___ Lap2 (185 feet)   HR__83___   02 Sat__94%RA___     Lap3 (185 feet)   HR__84___   02 Sat__96%RA___  _x__Test Completed without Difficulty ___Test Stopped due to:

## 2010-04-17 NOTE — Miscellaneous (Signed)
Summary: [facility]/prep  Clinical Lists Changes  Medications: Added new medication of MOVIPREP 100 GM  SOLR (PEG-KCL-NACL-NASULF-NA ASC-C) As per prep instructions. - Signed Rx of MOVIPREP 100 GM  SOLR (PEG-KCL-NACL-NASULF-NA ASC-C) As per prep instructions.;  #1 x 0;  Signed;  Entered by: Wyona Almas RN;  Authorized by: Meryl Dare MD [facility];  Method used: Electronically to [id number]*, [address] [place], [place], [place]  [address], Ph: [phone], Fax: [phone] Allergies: Added new allergy or adverse reaction of LIPITOR Changed allergy or adverse reaction from * VYTORIN to * VYTORIN    Prescriptions: MOVIPREP 100 GM  SOLR (PEG-KCL-NACL-NASULF-NA ASC-C) As per prep instructions.  #1 x 0   Entered by:   Wyona Almas RN   Authorized by:   Meryl Dare MD [facility]   Signed by:   Wyona Almas RN on 02/15/2010   Method used:   Electronically to        [facility]* (retail)       [address] [place], [place]  [address]       Ph: [phone]       Fax: [phone]   RxID:   [id number]

## 2010-04-17 NOTE — Assessment & Plan Note (Signed)
Summary: hospital f/u per dr stuckey//lch   Vital Signs:  Patient profile:   77 year old female Weight:      152.13 pounds O2 Sat:      96 % on Room air Pulse rate:   75 / minute Pulse rhythm:   regular BP sitting:   126 / 80  (left arm) Cuff size:   large  Vitals Entered By: Army Fossa CMA (November 10, 2009 9:13 AM)  O2 Flow:  Room air CC: Hospital f/u.  Comments Pt was started on Lipitor 20 mg daily- unable to add to med list because there is an OV on hold.    History of Present Illness: Hospital followup DATE OF ADMISSION:  10/31/2009 DATE OF DISCHARGE:  11/02/2009   chart is reviewed  was admitted with chest pain, Myoview was negative, ejection fraction of 72% She had oxygen desaturation at night, was recommended to be checked  for sleep apnea Was also recommended to restart Lipitor given her high cholesterol, patient was agreeable  Pertinent labs and x-rays... Chest x-ray negative d-dimer negative BMP and LFTs normal Hemoglobin 12.8 The cholesterol 260, HDL 58, LDL 182   Allergies: 1)  ! 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 Not sleeping. Sleeps poorly, been told she snores denies cough or wheezing No lower extremity edema, no leg pain. No recent airplane trips or prolonged car trips also complained of severe anxiety, her daughter lives at her house with her 2 teenager sons, she doesn't work, she does not help  with house chores.  Physical Exam  General:  alert, well-developed, and well-nourished.   Lungs:  normal  respiratory effort, no intercostal retractions, no accessory muscle use, and normal breath sounds.   Heart:  normal rate, regular rhythm, no murmur, and no gallop.   Extremities:  no edema, calves symmetric Psych:  and shoes and tearful when we talked about her daughter, see  review of systems   Impression & Recommendations:  Problem # 1:  CHEST PAIN (ICD-786.50) resolved , negative stress test  Problem # 2:  ANXIETY (ICD-300.00) counseled Will call if ready to take medication, has taken prozac before, did not like it. ?xanax Will provide the number for Dr. Dellia Cloud, in case she decides to go for counseling  Problem # 3:  HYPOXEMIA (ICD-799.02) hypoxemia in the hospital, O2 sat today 96% she does have some dyspnea Refer to pulmonary, sleep apnea?  Orders: Pulmonary Referral (Pulmonary)  Complete Medication List: 1)  Baby Aspirin 81 Mg Chew (Aspirin) .Marland Kitchen.. 1 by mouth qd 2)  Cod Liver Oil Oil (Cod liver oil) .... Take 1 capsule by mouth once a day 3)  Flax Seed Oil 1000 Mg Caps (Flaxseed (linseed)) .... Take 1 capsule by mouth once a day 4)  Vitamin B-2 50 Mg Tabs (Riboflavin) .... Take 1 tablet by mouth once a day 5)  Vitamin D 1000 Unit Tabs (Cholecalciferol) .... Take 1 tablet by mouth once a day 6)  Lipitor Dose ?  Marland Kitchen... Take 1 tablet by mouth once a day  Patient Instructions: 1)  Dr. Dellia Cloud, counselor, [phone] 2)  Continue taking aspirin and Lipitor 3)  Please come back fasting in 2 months

## 2010-04-17 NOTE — Assessment & Plan Note (Signed)
Summary: WALK-IN----CHEST PAINS, FLUSHED, SWEATY////SPH   Vital Signs:  Patient profile:   77 year old female Height:      63 inches Weight:      155.4 pounds BMI:     27.63 O2 Sat:      95 % on Room air Pulse rate:   72 / minute BP sitting:   144 / 88  Vitals Entered By: Army Fossa CMA (October 31, 2009 12:37 PM)  O2 Flow:  Room air CC: pt c/o BP being high for 2 days, also with chest pain. Has recently started working out.    History of Present Illness: chief complaint chest pain The patient started to go to the gym last week to lose some weight Today was her third visit to the gym , immediately after she finished her routine she developed chest. CP is described as tightness , started approximately at 11 AM, the pain was located in the mid  chest, somehow radiated to the throat, no radiation to the shoulder.  The pain gradually decreased and at this point is almost gone. She did have some nausea with it. Pain doesn't seem to be worse by turning her torso her BP has been checked twice in the last 48 hours and both times it has been high. No actual readings.    ROS No fever but she felt flushed No cough She has mild acid reflux and heartburn on - off for a while, has some today. No abdominal pain. She was slightly short of breath at the time of the chest discomfort No recent airplane trip, leg swelling or leg pain She has been able to play tennis as usual up until yesterday without any problems She self discontinued Lipitor approximately 3 weeks ago because she heard that it may cause problems, had aspirin 81 mg today   Current Medications (verified): 1)  Baby Aspirin 81 Mg  Chew (Aspirin) .Marland Kitchen.. 1 By Mouth Qd  Allergies (verified): 1)  ! * Vytorin  Past History:  Past Medical History: Reviewed history from 02/15/2008 and no changes required. Osteopenia Hyperlipidemia MENOPAUSE, SURGICAL  Hx of COLONIC POLYPS    Past Surgical History: Reviewed history from  02/27/2009 and no changes required. Cholecystectomy Hysterectomy,  B Oophorectomy  Family History: Reviewed history from 02/27/2009 and no changes required. thyroid cancer--daughter Breast ca-- sister MI--no DM-- brother (deceased) colon ca--no lung ca--father  Social History: Reviewed history from 02/27/2009 and no changes required. 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   Lungs:  normal respiratory effort, no intercostal retractions, no accessory muscle use, and normal breath sounds.   Heart:  normal rate, regular rhythm, no murmur, and no gallop.   Abdomen:  soft, non-tender, no distention, no masses, no guarding, and no rigidity.   Extremities:  no lower extremity edema, calves are symmetric and nontender Psych:  slightly anxious but alert and oriented   Impression & Recommendations:  Problem # 1:  CHEST PAIN (ICD-786.50)  the patient presents with chest pain with some typical features The pain  is almost resolved now EKG shows sinus rhythm with no acute changes, no old EKGs I am concerned about this represent  angina. plan: gave 3 aspirins 81mg  now  admit to the hospital, telemetry Rule out  MI d/w cardiology who will  take the patient  Patient declined an ambulance  Orders: EKG w/ Interpretation ([address])  Complete Medication List: 1)  Baby Aspirin 81 Mg Chew (Aspirin) .Marland Kitchen.. 1 by mouth qd

## 2010-04-17 NOTE — Procedures (Signed)
Summary: Oximetry / [facility]  Oximetry / [facility]   Imported By: Lennie Odor 12/22/2009 14:16:22  _____________________________________________________________________  External Attachment:    Type:   Image     Comment:   External Document  Appended Document: Oximetry / [facility] sever hypoxia, she is now on O2 please call patient , I rec again pulmonary eval  Appended Document: Orders Update Spoke with pt she is willing to see Pulm, will put in referral.   Clinical Lists Changes  Orders: Added new Referral order of Pulmonary Referral (Pulmonary) - Signed

## 2010-04-17 NOTE — Progress Notes (Signed)
Summary: next follow up in 2 weeks  Phone Note Other Incoming   Summary of Call: call from Dr. Riley Kill, she was in the hospital d/t  chest pain, stress test essentially negative. Plan: Follow up with me in a couple weeks, she is to gradually increase her activity level Jose E. Paz MD  November 03, 2009 3:39 PM

## 2010-04-17 NOTE — Progress Notes (Signed)
Summary: refill  Phone Note Refill Request   Refills Requested: Medication #1:  alendronate sodium 70 mg [facility] - fax [phone] --phone [phone]  Initial call taken by: Okey Regal [place],  August 31, 2009 10:13 AM  Follow-up for Phone Call        spoke with pt does not need med now. med stop back in dec 2010.Marland KitchenMarland KitchenMarland KitchenFelecia Deloach CMA  August 31, 2009 2:51 PM

## 2010-04-17 NOTE — Letter (Signed)
Summary: CMN for Oxygen / [facility]  CMN for Oxygen / [facility]   Imported By: Lennie Odor 12/22/2009 14:14:57  _____________________________________________________________________  External Attachment:    Type:   Image     Comment:   External Document

## 2010-04-19 ENCOUNTER — Encounter: Payer: Self-pay | Admitting: Pulmonary Disease

## 2010-04-19 NOTE — Procedures (Signed)
Summary: Colonoscopy  Patient: [name] Note: All result statuses are Final unless otherwise noted.  Tests: (1) Colonoscopy (COL)   COL Colonoscopy           DONE     [facility]     [address]     [place], [place]  [address]           COLONOSCOPY PROCEDURE REPORT     PATIENT:  Alicia Joseph, Alicia Joseph  MR#:  [id number]     BIRTHDATE:  01-Nov-1929, 80 yrs. old  GENDER:  female     ENDOSCOPIST:  Judie Petit T. Russella Dar, MD, [facility]           PROCEDURE DATE:  04/12/2010     PROCEDURE:  Colonoscopy 295[address]     ASA CLASS:  Class II     INDICATIONS:  1) Routine Risk Screening     MEDICATIONS:   Fentanyl 50 mcg IV, Versed 5 mg IV     DESCRIPTION OF PROCEDURE:   After the risks benefits and     alternatives of the procedure were thoroughly explained, informed     consent was obtained.  Digital rectal exam was performed and     revealed no abnormalities.   The LB [id number] endoscope     was introduced through the anus and advanced to the cecum, which     was identified by both the appendix and ileocecal valve, without     limitations.  The quality of the prep was excellent, using     MoviPrep.  The instrument was then slowly withdrawn as the colon     was fully examined.     <<PROCEDUREIMAGES>>     FINDINGS:  Moderate diverticulosis was found in the sigmoid colon.     A normal appearing cecum, ileocecal valve, and appendiceal orifice     were identified. The ascending, hepatic flexure, transverse,     splenic flexure, descending colon, and rectum appeared     unremarkable. Retroflexed views in the rectum revealed no     abnormalities.  The time to cecum =  1.33  minutes. The scope was     then withdrawn (time =  10.5  min) from the patient and the     procedure completed.           COMPLICATIONS:  None           ENDOSCOPIC IMPRESSION:     1) Moderate diverticulosis in the sigmoid colon           RECOMMENDATIONS:     1) High fiber diet with liberal fluid intake.           Venita Lick. Russella Dar, MD, [facility]           n.     eSIGNED:   Venita Lick. Silvio Sausedo at 04/12/2010 10:46 AM           Vedia Coffer, [id number]  Note: An exclamation mark (!) indicates a result that was not dispersed into the flowsheet. Document Creation Date: 04/12/2010 10:46 AM _______________________________________________________________________  (1) Order result status: Final Collection or observation date-time: 04/12/2010 10:42 Requested date-time:  Receipt date-time:  Reported date-time:  Referring Physician:   Ordering Physician: Claudette Head [phone]) Specimen Source:  Source: Launa Grill Order Number: [id number] Lab site:

## 2010-04-19 NOTE — Assessment & Plan Note (Signed)
Summary: cough/kn   Vital Signs:  Patient profile:   77 year old female Weight:      150.50 pounds Temp:     98.1 degrees F oral Pulse rate:   72 / minute Pulse rhythm:   regular BP sitting:   126 / 88  (left arm) Cuff size:   regular  Vitals Entered By: Army Fossa CMA (February 27, 2010 8:29 AM) CC: Pt here c/o chest congestion Comments -drainage - x 2 weeks not fasting  has been using a cough syrup [facility]   History of Present Illness: Symptoms started 2 weeks ago Chest congestion Cough, dry  PN Drainage       Current Medications (verified): 1)  Baby Aspirin 81 Mg  Chew (Aspirin) .Marland Kitchen.. 1 By Mouth Qd 2)  Cod Liver Oil  Oil (Cod Liver Oil) .... Take 1 Capsule By Mouth Once A Day 3)  Flax Seed Oil 1000 Mg Caps (Flaxseed (Linseed)) .... Take 1 Capsule By Mouth Once A Day 4)  Vitamin D 1000 Unit Tabs (Cholecalciferol) .... Take 1 Tablet By Mouth Once A Day 5)  Lipitor 20 Mg Tabs (Atorvastatin Calcium) .... *******hold******take One Tablet By Mouth Daily. 6)  Metoprolol Succinate 25 Mg Xr24h-Tab (Metoprolol Succinate) .... Take One Tablet By Mouth Daily- Per Pt Taking.  Allergies (verified): 1)  ! * Vytorin 2)  ! Lipitor  Past History:  Past Medical History: chest pain, negative stress test, 10-2009 Osteopenia Hyperlipidemia MENOPAUSE, SURGICAL  Hx of COLONIC POLYPS   nocturnal hypoxia, DX 2011, saw  pulmonary, no clear etiology, on nocturnal oxygen  Past Surgical History: Reviewed history from 11/09/2009 and no changes required. Cholecystectomy Hysterectomy,  B Oophorectomy  Review of Systems General:  Denies chills; (+) low grade subjective fever. ENT:  Complains of sore throat; (+) frontal HA. GI:  Denies nausea and vomiting. MS:  some muscle aches .  Physical Exam  General:  alert, well-developed, and well-nourished.   Head:  face symmetric, nontender to palpation Ears:  R ear normal and L ear normal.   Nose:  slightly congestive Mouth:   no  redness or discharge Lungs:  normal respiratory effort, no intercostal retractions, no accessory muscle use, and normal breath sounds.     Impression & Recommendations:  Problem # 1:  COUGH (ICD-786.2) cough postnasal dripping for one week, physical exam negative, likely URI The patient requests a refill on Bromphenex HD ( HYDROCODONE+PSEUDOEPHED+BROMPHEN) which helped in the past  see  instructions  Complete Medication List: 1)  Baby Aspirin 81 Mg Chew (Aspirin) .Marland Kitchen.. 1 by mouth qd 2)  Cod Liver Oil Oil (Cod liver oil) .... Take 1 capsule by mouth once a day 3)  Flax Seed Oil 1000 Mg Caps (Flaxseed (linseed)) .... Take 1 capsule by mouth once a day 4)  Vitamin D 1000 Unit Tabs (Cholecalciferol) .... Take 1 tablet by mouth once a day 5)  Lipitor 20 Mg Tabs (Atorvastatin calcium) .... *******hold******take one tablet by mouth daily. 6)  Metoprolol Succinate 25 Mg Xr24h-tab (Metoprolol succinate) .... Take one tablet by mouth daily- per pt taking. 7)  Hydrocodone-homatropine 5-1.5 Mg/80ml Syrp (Hydrocodone-homatropine) .Marland Kitchen.. 1 teaspoon every 4 hours as needed for cough  Patient Instructions: 1)  rest, fluids, Tylenol 2)  Mucinex DM over-the-counter twice a day as needed for cough 3)  If the cough persists, take hydrocodone. 4)  Call if not better in 4-5 days Prescriptions: HYDROCODONE-HOMATROPINE 5-1.5 MG/5ML SYRP (HYDROCODONE-HOMATROPINE) 1 teaspoon every 4 hours as needed for cough  #  150cc x 0   Entered and Authorized by:   Nolon Rod. Paz MD   Signed by:   Nolon Rod. Paz MD on 02/27/2010   Method used:   Print then Give to Patient   RxID:   [id number]    Orders Added: 1)  Est. Patient Level III [[address]]

## 2010-04-19 NOTE — Progress Notes (Signed)
Summary: Temazepam alternative  Phone Note Call from Patient   Reason for Call: Privacy/Consent Authorization Summary of Call: Patient called the office noting that Temazepam will cost her $182.99 and she cannot do that. She is requesting generic Xanax instead. Please send to [facility] on [address].  Please advise. Initial call taken by: Lucious Groves CMA,  March 06, 2010 4:25 PM  Follow-up for Phone Call        change to alprazolam 0.5 mg one p.o. q.h.s. p.r.n. #30, 1 refill Follow-up by: Nolon Rod. Lyncoln Maskell MD,  March 07, 2010 8:16 AM  Additional Follow-up for Phone Call Additional follow up Details #1::        Patient aware. Lucious Groves CMA  March 07, 2010 8:48 AM     New/Updated Medications: ALPRAZOLAM 0.5 MG TABS (ALPRAZOLAM) one p.o. q.h.s. p.r.n. Prescriptions: ALPRAZOLAM 0.5 MG TABS (ALPRAZOLAM) one p.o. q.h.s. p.r.n.  #30 x 1   Entered by:   Lucious Groves CMA   Authorized by:   Nolon Rod. Taneia Mealor MD   Signed by:   Lucious Groves CMA on 03/07/2010   Method used:   Telephoned to ...       [facility] [address] (retail)       [address]       [place], [place]  [address]       Ph: [phone]       Fax: [phone]   RxID:   [id number]

## 2010-04-19 NOTE — Letter (Signed)
Summary: Reschedule note from patient  Reschedule note from patient   Imported By: Lester Menominee 03/03/2010 10:28:39  _____________________________________________________________________  External Attachment:    Type:   Image     Comment:   External Document

## 2010-04-19 NOTE — Assessment & Plan Note (Signed)
Summary: YEARLY  EXAM AND FASTING LABS////SPH   Vital Signs:  Patient profile:   77 year old female Height:      64 inches Weight:      147.38 pounds Pulse rate:   79 / minute Pulse rhythm:   regular BP sitting:   128 / 88  (left arm) Cuff size:   regular  Vitals Entered By: Army Fossa CMA (March 06, 2010 8:35 AM) CC: CPX, fasting  Comments would like something else to help get rid of the cough discuss pap [facility]   History of Present Illness: Here for Medicare AWV:  1.   Risk factors based on Past M, S, F history:reviewed 2.   Physical Activities: exercise 3/week, tennis 2/week (depending on weather) 3.   Depression/mood: ++ stress at home d/t relationship betwen her 2nd husband and children  4.   Hearing: decreased, some problems , not severe, declined referal to audiologist d/t $$ 5.   ADL's: totally independent  6.   Fall Risk: low risk, no recent falls  7.   Home Safety: feels safe at home   8.   Height, weight, &visual acuity: see VS, vision well corrected w/  glasses  9.   Counseling: yes, see assessment and plan 10.   Labs ordered based on risk factors: yes 11.           Referral Coordination, if needed 12.           Care Plan, see assessment and plan 13.            Cognitive Assessment: motor skills, cognition and memory seem appropriate  in addition, we discussed the following  Osteopenia-- dexa normal 10-11, h/o low vit D Hyperlipidemia-- off lipitor at this point d/t feet pain, pain decreasing gradually   nocturnal hypoxia,  having problems keeping O2 on at night   Current Medications (verified): 1)  Baby Aspirin 81 Mg  Chew (Aspirin) .Marland Kitchen.. 1 By Mouth Qd 2)  Cod Liver Oil  Oil (Cod Liver Oil) .... Take 1 Capsule By Mouth Once A Day 3)  Flax Seed Oil 1000 Mg Caps (Flaxseed (Linseed)) .... Take 1 Capsule By Mouth Once A Day 4)  Vitamin D 1000 Unit Tabs (Cholecalciferol) .... Take 1 Tablet By Mouth Once A Day 5)  Metoprolol Succinate 25 Mg Xr24h-Tab  (Metoprolol Succinate) .... Take One Tablet By Mouth Daily- Per Pt Taking. 6)  Hydrocodone-Homatropine 5-1.5 Mg/31ml Syrp (Hydrocodone-Homatropine) .Marland Kitchen.. 1 Teaspoon Every 4 Hours As Needed For Cough  Allergies (verified): 1)  ! * Vytorin 2)  ! Lipitor  Past History:  Past Medical History: Reviewed history from 02/27/2010 and no changes required. chest pain, negative stress test, 10-2009 Osteopenia Hyperlipidemia MENOPAUSE, SURGICAL  Hx of COLONIC POLYPS   nocturnal hypoxia, DX 2011, saw  pulmonary, no clear etiology, on nocturnal oxygen  Past Surgical History: Reviewed history from 11/09/2009 and no changes required. Cholecystectomy Hysterectomy,  B Oophorectomy  Family History: thyroid cancer--daughter Breast ca-- sister, deceased  MI--no DM-- brother (deceased) colon ca--no lung ca--father Her mother died at 43 and her father died in his 25s,   neither one with heart disease and no siblings have heart disease.      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 CV:  Denies chest pain or discomfort and swelling of feet. Resp:  Denies cough and shortness of breath. GI:  Denies bloody stools, constipation, and diarrhea. GU:  no vag d/c or bleed  not doing SBE.  Physical Exam  General:  alert, well-developed, and well-nourished.   Neck:  no masses and no thyromegaly.   Breasts:  No mass, nodules, thickening, tenderness, bulging, retraction, inflamation, nipple discharge or skin changes noted.  no LADs Lungs:  normal respiratory effort, no intercostal retractions, no accessory muscle use, and normal breath sounds.   Heart:  normal rate, regular rhythm, no murmur, and no gallop.   Abdomen:  soft, non-tender, no distention, no masses, no  guarding, and no rigidity.   Extremities:  no pretibial edema bilaterally  Psych:  not anxious appearing and not depressed appearing.     Impression & Recommendations:  Problem # 1:  HEALTH SCREENING (ICD-V70.0)  Td 12-10 Last Pneumovax:  01/14/2007 had shingles shot had a flu shot  Pap Smear:  normal,  07/17/2007, no history of previous abnormal Pap smears, history of hysterectomy. ....  Next Due:  07/2010     Mammogram: 12/26/2009....Marland Kitchen neg  self breast exam recommended breast exam today normal  Colonoscopy: 2005, next 2010, Dr. Russella Dar . Due for a colonoscopy now, plan to have it done soon  diet exercise discussed  Orders: Medicare -1st Annual Wellness Visit [phone])  Problem # 2:  HYPERTENSION, BENIGN (ICD-401.1) no change Her updated medication list for this problem includes:    Metoprolol Succinate 25 Mg Xr24h-tab (Metoprolol succinate) .Marland Kitchen... Take one tablet by mouth daily- per pt taking.  BP today: 128/88 Prior BP: 126/88 (02/27/2010)  Labs Reviewed: K+: 3.6 (02/27/2009) Creat: : 0.6 (02/27/2009)   Chol: 176 (12/05/2009)   HDL: 57.40 (12/05/2009)   LDL: 103 (12/05/2009)   TG: 78.0 (12/05/2009)  Orders: Venipuncture (60454) TLB-BMP (Basic Metabolic Panel-BMET) (80048-METABOL)  Problem # 3:  HYPOXEMIA (ICD-799.02) having a difficult time using oxygen at night, encouraged to do her best with compliance  Problem # 4:  ANXIETY (ICD-300.00) we talk about options for treatment.... declined counseling or medication for anxiety She did agree to try some medication for insomnia start temazepam  Problem # 5:  HYPERLIPIDEMIA (ICD-272.4) intolerant to Lipitor due to  feet pain recheck a FLP on RTC  The following medications were removed from the medication list:    Lipitor 20 Mg Tabs (Atorvastatin calcium) .Marland Kitchen... *******hold******take one tablet by mouth daily.  Orders: TLB-ALT (SGPT) (84460-ALT) TLB-AST (SGOT) (84450-SGOT)  Problem # 6:  OSTEOPENIA  (ICD-733.90)  Bone density test 12/2009.... negative History of low vitamin D 02/2009  Her updated medication list for this problem includes:    Vitamin D 1000 Unit Tabs (Cholecalciferol) .Marland Kitchen... Take 1 tablet by mouth once a day  Orders: T-Vitamin D (25-Hydroxy) (09811-91478)  Problem # 7:  COUGH (ICD-786.2) still cough some , request a 2nd round of abx rec. to wait few more days   Complete Medication List: 1)  Baby Aspirin 81 Mg Chew (Aspirin) .Marland Kitchen.. 1 by mouth qd 2)  Cod Liver Oil Oil (Cod liver oil) .... Take 1 capsule by mouth once a day 3)  Flax Seed Oil 1000 Mg Caps (Flaxseed (linseed)) .... Take 1 capsule by mouth once a day 4)  Vitamin D 1000 Unit Tabs (Cholecalciferol) .... Take 1 tablet by mouth once a day 5)  Metoprolol Succinate 25 Mg Xr24h-tab (Metoprolol succinate) .... Take one tablet by mouth daily- per pt taking.  6)  Hydrocodone-homatropine 5-1.5 Mg/54ml Syrp (Hydrocodone-homatropine) .Marland Kitchen.. 1 teaspoon every 4 hours as needed for cough 7)  Temazepam 7.5 Mg Caps (Temazepam) .... One by mouth at bedtime as needed for difficulty sleeping  Patient Instructions: 1)  Please schedule a follow-up appointment in 4 months , fasting Prescriptions: TEMAZEPAM 7.5 MG CAPS (TEMAZEPAM) one by mouth at bedtime as needed for difficulty sleeping  #30 x 1   Entered and Authorized by:   Elita Quick E. Shawnae Leiva MD   Signed by:   Nolon Rod. Nicolaos Mitrano MD on 03/06/2010   Method used:   Print then Give to Patient   RxID:   [id number]    Orders Added: 1)  Venipuncture [41324] 2)  TLB-BMP (Basic Metabolic Panel-BMET) [80048-METABOL] 3)  TLB-ALT (SGPT) [84460-ALT] 4)  TLB-AST (SGOT) [84450-SGOT] 5)  T-Vitamin D (25-Hydroxy) [40102-72536] 6)  Est. Patient Level III [64403] 7)  Medicare -1st Annual Wellness Visit [G0438]     Risk Factors:  Mammogram History:     Date of Last Mammogram:  12/16/2009    Results:  normal     Preventive Care Screening  Mammogram:    Date:  12/16/2009    Results:   normal

## 2010-04-19 NOTE — Progress Notes (Signed)
Summary: Pick up O2 concentator   Phone Note Call from Patient Call back at Home Phone [phone]   Summary of Call: Patient called noting that she is using a rental O2 concentrator from [employer]. She notes that she does not need it anymore and need MD signature before she can return it. Pt  could not tell me the name of her rep, but said that she called [phone].Lucious Groves CMA,  March 14, 2010 4:50 PM  I spoke with [facility] and they do need discontinue order from MD before they can pick it up from the patient. They have orders from the patient to have it switched, they will check with the patient and call us back. Lucious Groves CMA  March 15, 2010 9:14 AM   Follow-up for Phone Call        Patient spouse was here today and this was discussed with him. He called the patient from his cell and she was notified of my coversation with [facility] this AM. There was confusion on [employer]'s behalf about what the patient needed and she now has cleared that up with them. She needs a d/c order/script sent to [facility] in order for them to pick up the machine. Please advise. Lucious Groves CMA  March 15, 2010 2:03 PM   Additional Follow-up for Phone Call Additional follow up Details #1::        our recommendation is that she keep using oxygen at night. However if she refuses, then we can just send the order for discontinue oxygen Additional Follow-up by: Jose E. Paz MD,  March 16, 2010 12:10 PM    Additional Follow-up for Phone Call Additional follow up Details #2::    Spoke w/ pt and she refuses to stay on it. I will send d/c to [facility]. Army Fossa CMA  March 16, 2010 1:42 PM   New/Updated Medications: * OXYGEN d/c oxygen Prescriptions: OXYGEN d/c oxygen  #0 x 0   Entered by:   Army Fossa CMA   Authorized by:   Nolon Rod. Paz MD   Signed by:   Army Fossa CMA on 03/16/2010   Method used:   Printed then faxed to ...       [facility] [phone]* (retail)       [address] [place], [place]  [address]       Ph: [phone]       Fax: [phone]   RxID:   [id number]

## 2010-04-19 NOTE — Progress Notes (Signed)
Summary: has fever with bronchitis --needs antibiotic  Phone Note Call from Patient   Caller: patient and daughter =Tina = [phone] Summary of Call: daughter Inetta Fermo called because she is concerned about her Mom---says Mom was seen earlier in week and was told to take Tylanol, Hydrocodone and Musinex for her bronchitis---she now has a fever and daughter would like her to get an antobiotic--please call it into [facility] Initial call taken by: Jerolyn Shin,  March 01, 2010 12:06 PM  Follow-up for Phone Call        call a Zpack, take as directed x 1  Elnor Renovato E. Veta Dambrosia MD  March 01, 2010 12:50 PM   Additional Follow-up for Phone Call Additional follow up Details #1::        Pt daughter aware rx sent to pharmacy..........Marland KitchenFelecia Deloach CMA  March 01, 2010 1:05 PM     New/Updated Medications: ZITHROMAX Z-PAK 250 MG TABS (AZITHROMYCIN) take as directed Prescriptions: ZITHROMAX Z-PAK 250 MG TABS (AZITHROMYCIN) take as directed  #1 x 0   Entered by:   Jeremy Johann CMA   Authorized by:   Nolon Rod. Viera Okonski MD   Signed by:   Jeremy Johann CMA on 03/01/2010   Method used:   Faxed to ...       [facility] [phone]* (retail)       [address] [place], [place]  [address]       Ph: [phone]       Fax: [phone]   RxID:   [id number]

## 2010-05-03 NOTE — Miscellaneous (Signed)
Summary: D/C O2 notes/[facility]  D/C O2 notes/[facility]   Imported By: Lester Henderson 04/27/2010 13:41:27  _____________________________________________________________________  External Attachment:    Type:   Image     Comment:   External Document

## 2010-05-31 LAB — CBC
HCT: 37.4 % (ref 36.0–46.0)
Hemoglobin: 12.8 g/dL (ref 12.0–15.0)
Hemoglobin: 13.1 g/dL (ref 12.0–15.0)
MCH: 32.3 pg (ref 26.0–34.0)
MCV: 92.3 fL (ref 78.0–100.0)
RBC: 4.01 MIL/uL (ref 3.87–5.11)
RBC: 4.05 MIL/uL (ref 3.87–5.11)
WBC: 10 10*3/uL (ref 4.0–10.5)

## 2010-05-31 LAB — CARDIAC PANEL(CRET KIN+CKTOT+MB+TROPI)
CK, MB: 2.4 ng/mL (ref 0.3–4.0)
CK, MB: 3.2 ng/mL (ref 0.3–4.0)
Relative Index: 2.9 — ABNORMAL HIGH (ref 0.0–2.5)
Total CK: 96 U/L (ref 7–177)

## 2010-05-31 LAB — COMPREHENSIVE METABOLIC PANEL
ALT: 15 U/L (ref 0–35)
AST: 18 U/L (ref 0–37)
Alkaline Phosphatase: 56 U/L (ref 39–117)
CO2: 27 mEq/L (ref 19–32)
Chloride: 104 mEq/L (ref 96–112)
GFR calc Af Amer: >60 mL/min (ref >60)
GFR calc non Af Amer: >60 mL/min (ref >60)
Sodium: 137 mEq/L (ref 135–145)
Total Bilirubin: 0.8 mg/dL (ref 0.3–1.2)

## 2010-05-31 LAB — PROTIME-INR: INR: 0.96 (ref 0.00–1.49)

## 2010-05-31 LAB — D-DIMER, QUANTITATIVE: D-Dimer, Quant: <0.22 ug/mL-FEU (ref 0.00–0.48)

## 2010-05-31 LAB — BASIC METABOLIC PANEL
BUN: 10 mg/dL (ref 6–23)
CO2: 27 mEq/L (ref 19–32)
Calcium: 9.1 mg/dL (ref 8.4–10.5)
Chloride: 101 mEq/L (ref 96–112)
Creatinine, Ser: 0.64 mg/dL (ref 0.4–1.2)
GFR calc Af Amer: >60 mL/min (ref >60)

## 2010-05-31 LAB — APTT: aPTT: 24 seconds (ref 24–37)

## 2010-06-12 ENCOUNTER — Other Ambulatory Visit: Payer: Self-pay | Admitting: Internal Medicine

## 2010-06-12 MED ORDER — ALPRAZOLAM 0.5 MG PO TABS: ORAL_TABLET | ORAL | Status: DC

## 2010-06-12 NOTE — Telephone Encounter (Signed)
Okay alprazolam 0.5 mg 1 by mouth each bedtime when necessary, #30, 2 RF

## 2010-08-03 NOTE — Op Note (Signed)
NAMEVONZELLA, ALTHAUS                  ACCOUNT NO.:  [id number]   MEDICAL RECORD NO.:  [id number]          PATIENT TYPE:  AMB   LOCATION:  DAY                          FACILITY:  [facility]   PHYSICIAN:  Sharlet Salina T. Hoxworth, M.D.DATE OF BIRTH:  Apr 22, 1929   DATE OF PROCEDURE:  01/30/2004  DATE OF DISCHARGE:                                 OPERATIVE REPORT   PREOPERATIVE DIAGNOSIS:  Chronic cholecystitis.   PREOPERATIVE DIAGNOSIS:  Chronic cholecystitis.   PROCEDURE:  Laparoscopic cholecystectomy with intraoperative cholangiogram.   SURGEON:  Dr. Johna Sheriff.   ASSISTANT:  Dr. Abigail Miyamoto.   ANESTHESIA:  General.   BRIEF HISTORY:  Ms. Trautner is a 77 year old female who presents with  recurrent episodes of typical biliary colic with pressure-like epigastric  right upper quadrant abdominal pain, nausea, and vomiting. She has had a  gallbladder ultrasound showing either polyps or small stones in the  gallbladder. Laparoscopic cholecystectomy with cholangiogram has been  recommended and accepted. Nature of procedure, indications, risks of  bleeding, infection, bile leak, bile duct surgery were discussed and  understood. She is now brought to the operating room for this procedure.   DESCRIPTION OF PROCEDURE:  The patient was brought to the operating room and  placed in supine position on the operating table, and general endotracheal  anesthesia was introduced. She was given preoperative antibiotics. PFs were  in place. The abdomen was widely sterilely prepped and draped. A 1-cm  incision was made just below the umbilicus, dissection carried down to the  midline fascia which was incised from 1 cm and the peritoneum entered under  direct vision.  Through a mattress suture of 0 Vicryl, the Hasson trocar was  placed and pneumoperitoneum established. Under direct vision, a 10-mm trocar  was placed in the subxiphoid area and two 5-mm trocars in the right  subcostal margin. The gallbladder was  visualized and did not appear acutely  inflamed. The fundus was grasped and elevated up over the liver. The  infundibulum was retracted inferolaterally. Fiber and fatty tissue was  dissected off the neck and gallbladder toward the porta hepatis and the  distal gallbladder and Calot's triangle were thoroughly dissected. The  cystic duct and gallbladder junction was dissected 360 degrees and the  cystic duct dissected over about a centimeter. When the anatomy was cleared,  the cystic duct was clipped at the gallbladder junction and operative  cholangiogram obtained through the cystic duct. This showed good filling of  normal common bile duct and intrahepatic ducts with free flow into the  duodenum and no filling defects. Following this, cholangiocatheter was  removed, and the cystic duct was doubly clipped proximally and divided.  Individual branches of the cystic artery was identified coursing up the  gallbladder wall, and these were divided between clips. The gallbladder was  then dissected free from its bed using the hook cautery and removed through  the umbilicus. Complete hemostasis was assured in the gallbladder bed.  Trocars were removed under direct vision and all CO2 evacuated. Mattress  suture was secured to the umbilicus. Skin incision closed with interrupted  subcuticular 4-0 Monocryl and Steri-Strips. Sponge, needle, and instrument  counts were correct. Dressings were applied and patient taken to recovery in  good condition.      BTH/MEDQ  D:  01/30/2004  T:  01/30/2004  Job:  [id number]

## 2010-10-22 ENCOUNTER — Telehealth: Payer: Self-pay | Admitting: *Deleted

## 2010-10-22 NOTE — Telephone Encounter (Signed)
Pt drove to [facility] to get a x-ray on her foot. I told them to tell her go home and I would talk to Dr. Drue Novel about either seeing her or working her in.

## 2010-10-22 NOTE — Telephone Encounter (Signed)
Left message for pt to call back  °

## 2010-10-23 ENCOUNTER — Encounter: Payer: Self-pay | Admitting: Internal Medicine

## 2010-10-23 ENCOUNTER — Ambulatory Visit (INDEPENDENT_AMBULATORY_CARE_PROVIDER_SITE_OTHER): Payer: Medicare Other | Admitting: Internal Medicine

## 2010-10-23 ENCOUNTER — Ambulatory Visit (INDEPENDENT_AMBULATORY_CARE_PROVIDER_SITE_OTHER)
Admission: RE | Admit: 2010-10-23 | Discharge: 2010-10-23 | Disposition: A | Discharge status: Home / Self Care | Payer: Medicare Other | Source: Ambulatory Visit | Attending: Internal Medicine | Admitting: Internal Medicine

## 2010-10-23 VITALS — BP 138/82 | HR 68 | Temp 98.1°F | Resp 14 | Wt 139.5 lb

## 2010-10-23 DIAGNOSIS — M79673 Pain in unspecified foot: Secondary | ICD-10-CM

## 2010-10-23 DIAGNOSIS — M79609 Pain in unspecified limb: Secondary | ICD-10-CM

## 2010-10-23 DIAGNOSIS — F411 Generalized anxiety disorder: Secondary | ICD-10-CM

## 2010-10-23 MED ORDER — ALPRAZOLAM 0.5 MG PO TABS: ORAL_TABLET | ORAL | Status: DC

## 2010-10-23 NOTE — Assessment & Plan Note (Signed)
Refill Xanax to help with insomnia

## 2010-10-23 NOTE — Progress Notes (Signed)
  Subjective:    Patient ID: Alicia Joseph, female    DOB: 1930-03-06, 77 y.o.   MRN: [id number]  HPI  R>L foot pain x 6 months, located at the dorsum of feet. She plays tennis , got feet braces and they seem to help. Changing to a softer tennis shoe did not   Past Medical History: chest pain, negative stress test, 10-2009 Osteopenia Hyperlipidemia MENOPAUSE, SURGICAL  Hx of COLONIC POLYPS   nocturnal hypoxia, DX 2011, saw  pulmonary, no clear etiology, on nocturnal oxygen  Past Surgical History: Cholecystectomy Hysterectomy,  B Oophorectomy  Review of Systems occ the area of pain get swollen, sometimes red No actual injury that she recalls No fever Also request a xanax RF for insomnia     Objective:   Physical Exam  Constitutional: She appears well-developed and well-nourished.  Cardiovascular:       Normal pedal pulses and toes capillary refill bilaterally  Musculoskeletal: She exhibits no edema.       Feet:       The patient is a slightly tender at the dorsum of the feet. No redness, warmth  or rash at this time. There is some localized swelling in the right foot. See graphic          Assessment & Plan:

## 2010-10-23 NOTE — Patient Instructions (Addendum)
Ice , tylenol Continue using the braces XRs Orthopedic doctor referal

## 2010-10-23 NOTE — Telephone Encounter (Signed)
Pt has appt today at 11:15am Pt informed reason for call.

## 2010-10-23 NOTE — Assessment & Plan Note (Signed)
R>>L foot pain x 6 months, exam + for some swelling in the R dorsum. Plan: XR Ortho referal

## 2010-10-24 ENCOUNTER — Telehealth: Payer: Self-pay | Admitting: *Deleted

## 2010-10-24 NOTE — Telephone Encounter (Signed)
Message copied by Regis Bill on Wed Oct 24, 2010 10:13 AM ------      Message from: Wanda Plump      Created: Tue Oct 23, 2010  6:34 PM       Advise patient, x-rays essentially okay except for arthritis. Plan is the same

## 2010-10-24 NOTE — Progress Notes (Signed)
Pt aware of results 

## 2010-10-24 NOTE — Telephone Encounter (Signed)
Patient informed. 

## 2010-11-22 ENCOUNTER — Telehealth: Payer: Self-pay | Admitting: *Deleted

## 2010-11-22 NOTE — Telephone Encounter (Signed)
Patient canceled 11/23/10 appt-did not feel f/u necessary at this time [for hyperlipidemia der Dr. Paz]. Patient will be seeing Orthopaedics 12/11/10. Scheduled CPE 02/2011 per patient request. Faxed Xray reports per patient request to [facility].

## 2010-11-23 ENCOUNTER — Ambulatory Visit: Payer: Medicare Other | Admitting: Internal Medicine

## 2011-01-11 ENCOUNTER — Other Ambulatory Visit: Payer: Self-pay | Admitting: Internal Medicine

## 2011-01-11 MED ORDER — ALPRAZOLAM 0.5 MG PO TABS: ORAL_TABLET | ORAL | Status: DC

## 2011-01-11 NOTE — Telephone Encounter (Signed)
Rx called in 

## 2011-01-11 NOTE — Telephone Encounter (Signed)
Ok 30 and 4 RF 

## 2011-02-22 ENCOUNTER — Other Ambulatory Visit: Payer: Self-pay | Admitting: Internal Medicine

## 2011-02-22 DIAGNOSIS — Z139 Encounter for screening, unspecified: Secondary | ICD-10-CM

## 2011-03-06 ENCOUNTER — Ambulatory Visit (HOSPITAL_BASED_OUTPATIENT_CLINIC_OR_DEPARTMENT_OTHER)
Admission: RE | Admit: 2011-03-06 | Discharge: 2011-03-06 | Disposition: A | Discharge status: Home / Self Care | Payer: Medicare Other | Source: Ambulatory Visit | Attending: Internal Medicine | Admitting: Internal Medicine

## 2011-03-06 DIAGNOSIS — Z139 Encounter for screening, unspecified: Secondary | ICD-10-CM

## 2011-03-06 DIAGNOSIS — Z1231 Encounter for screening mammogram for malignant neoplasm of breast: Secondary | ICD-10-CM | POA: Insufficient documentation

## 2011-03-07 ENCOUNTER — Encounter: Payer: Self-pay | Admitting: Internal Medicine

## 2011-03-08 ENCOUNTER — Encounter: Payer: Self-pay | Admitting: Internal Medicine

## 2011-03-08 ENCOUNTER — Ambulatory Visit (INDEPENDENT_AMBULATORY_CARE_PROVIDER_SITE_OTHER): Payer: Medicare Other | Admitting: Internal Medicine

## 2011-03-08 DIAGNOSIS — R7309 Other abnormal glucose: Secondary | ICD-10-CM

## 2011-03-08 DIAGNOSIS — Z0000 Encounter for general adult medical examination without abnormal findings: Secondary | ICD-10-CM

## 2011-03-08 DIAGNOSIS — R739 Hyperglycemia, unspecified: Secondary | ICD-10-CM | POA: Insufficient documentation

## 2011-03-08 DIAGNOSIS — M899 Disorder of bone, unspecified: Secondary | ICD-10-CM

## 2011-03-08 DIAGNOSIS — E559 Vitamin D deficiency, unspecified: Secondary | ICD-10-CM

## 2011-03-08 DIAGNOSIS — M79673 Pain in unspecified foot: Secondary | ICD-10-CM

## 2011-03-08 DIAGNOSIS — F411 Generalized anxiety disorder: Secondary | ICD-10-CM

## 2011-03-08 DIAGNOSIS — M79609 Pain in unspecified limb: Secondary | ICD-10-CM

## 2011-03-08 DIAGNOSIS — E785 Hyperlipidemia, unspecified: Secondary | ICD-10-CM

## 2011-03-08 DIAGNOSIS — M949 Disorder of cartilage, unspecified: Secondary | ICD-10-CM

## 2011-03-08 DIAGNOSIS — I10 Essential (primary) hypertension: Secondary | ICD-10-CM

## 2011-03-08 LAB — CBC WITH DIFFERENTIAL/PLATELET
Basophils Relative: 0.5 % (ref 0.0–3.0)
Eosinophils Absolute: 0.2 10*3/uL (ref 0.0–0.7)
Hemoglobin: 14 g/dL (ref 12.0–15.0)
Lymphocytes Relative: 21.2 % (ref 12.0–46.0)
MCHC: 33.9 g/dL (ref 30.0–36.0)
MCV: 96.2 fl (ref 78.0–100.0)
Neutro Abs: 5.6 10*3/uL (ref 1.4–7.7)
RBC: 4.3 Mil/uL (ref 3.87–5.11)

## 2011-03-08 LAB — LIPID PANEL
HDL: 78.3 mg/dL (ref >39.00)
Total CHOL/HDL Ratio: 4

## 2011-03-08 LAB — BASIC METABOLIC PANEL
CO2: 29 mEq/L (ref 19–32)
Calcium: 9 mg/dL (ref 8.4–10.5)
Chloride: 100 mEq/L (ref 96–112)
Sodium: 138 mEq/L (ref 135–145)

## 2011-03-08 LAB — HEMOGLOBIN A1C: Hgb A1c MFr Bld: 5.9 % (ref 4.6–6.5)

## 2011-03-08 MED ORDER — METOPROLOL SUCCINATE ER 25 MG PO TB24: 25.0000 mg | ORAL_TABLET | Freq: Every day | ORAL | Status: DC

## 2011-03-08 NOTE — Assessment & Plan Note (Signed)
Bone density test 12/2009.... negative History of low vitamin D 02/2009 Plan: Labs DEXA next year

## 2011-03-08 NOTE — Assessment & Plan Note (Signed)
CBGs elevated sometimes, labs

## 2011-03-08 NOTE — Patient Instructions (Signed)
Exercise daily 

## 2011-03-08 NOTE — Assessment & Plan Note (Signed)
Off metoprolol , reason? rec to go back to it, SBP ~150

## 2011-03-08 NOTE — Assessment & Plan Note (Signed)
Has a complicated life, see SH, HPI. Doing well w/ xanax at night, declined Rx for other meds

## 2011-03-08 NOTE — Progress Notes (Signed)
  Subjective:    Patient ID: Alicia Joseph, female    DOB: [birth year], 77 y.o.   MRN: [id number]  HPI Here for Medicare AWV: 1. Risk factors based on Past M, S, F history:reviewed 2. Physical Activities:  unable to play tennis due to feet ache  3. Depression/mood: still stress at home d/t relationship betwen her 2nd husband and children  4. Hearing: decreased, about the same, rec to call when ready for a referral, cost is an issue  5. ADL's: totally independent , still drives  6. Fall Risk: no recent falls , counseled about prevention 7. Home Safety: feels safe at home   8. Height, weight, &visual acuity: see VS, vision well corrected w/  glasses  9. Counseling: yes, see assessment and plan 10. Labs ordered based on risk factors: yes 11.           Referral Coordination, if needed 12.           Care Plan, see assessment and plan 13.            Cognitive Assessment: motor skills, cognition and memory seem appropriate  in addition, we discussed the following Feet still hurt, saw ortho, was Rx inserts Osteopenia-- dexa normal 10-11, h/o low vit D, good compliance w/ Ca and vit d Hyperlipidemia-- off lipitor  nocturnal hypoxia, declined to use oxygen Insomnia well controlled w/ xanax   Past Medical History: chest pain, negative stress test, 10-2009 Osteopenia Hyperlipidemia MENOPAUSE, SURGICAL  Hx of COLONIC POLYPS   nocturnal hypoxia, DX 2011, saw  pulmonary, no clear etiology, on nocturnal oxygen  Past Surgical History: Cholecystectomy Hysterectomy,  B Oophorectomy  Family History: thyroid cancer--daughter Breast ca-- sister, deceased  MI--no DM-- brother (deceased) colon ca--no lung ca--father Her mother died at 24 and her father died in his 17s,   Social History: widow, re-married original from the [place], [place] from [place] 2 kids, 1 daughter in [place], the other daughter and 2 teenagers live close to her, she supports them financially tobacco , quit in her early 79s ETOH--  wine socially   Review of Systems No chest pain or shortness or breath No nausea, vomiting, diarrhea. No blood in the stools. Occasionally feels depressed, no suicidal ideas, denies  The need to take any medication except occasional Xanax. Denies any vaginal discharge or bleeding.     Objective:   Physical Exam  Constitutional: She is oriented to person, place, and time. She appears well-developed and well-nourished. No distress.  HENT:  Head: Normocephalic and atraumatic.  Neck: No thyromegaly present.       Normal carotid pulses  Cardiovascular: Normal rate, regular rhythm and normal heart sounds.   No murmur heard. Pulmonary/Chest: Effort normal and breath sounds normal. No respiratory distress. She has no wheezes. She has no rales.  Abdominal: Soft. Bowel sounds are normal. She exhibits no distension. There is no tenderness. There is no rebound and no guarding.  Genitourinary:       Breasts exam normal, no axillary lymph nodes  Musculoskeletal: She exhibits no edema.  Neurological: She is alert and oriented to person, place, and time.  Skin: She is not diaphoretic.  Psychiatric: She has a normal mood and affect. Her behavior is normal. Judgment and thought content normal.       Assessment & Plan:

## 2011-03-08 NOTE — Assessment & Plan Note (Signed)
D/c lipitor due to feet pain, clearly lipitor did not cause it ( she still has pain): labs

## 2011-03-08 NOTE — Assessment & Plan Note (Addendum)
Ongoing issues since ~ 12-2009, unable to play tennis anymore, s/p ortho eval rec to go back to them if sx persist Also encouraged to stay active in other activities different than tennis

## 2011-03-08 NOTE — Assessment & Plan Note (Addendum)
Td 12-10 Last Pneumovax:  01/14/2007 had shingles shot had a flu shot  Pap Smear:  normal,  07/17/2007, no history of previous abnormal Pap smears, history of hysterectomy ( for benign reasons), declined further PAPs    Mammogram: 12/26/2009 and this week (report pending) self breast exam recommended breast exam today normal  Colonoscopy: 2005, repeated Cscope 1- 2012, no further cscopes, Dr. Russella Dar . diet exercise discussed

## 2011-03-09 LAB — VITAMIN D 25 HYDROXY (VIT D DEFICIENCY, FRACTURES): Vit D, 25-Hydroxy: 33 ng/mL (ref 30–89)

## 2011-10-24 ENCOUNTER — Emergency Department (HOSPITAL_COMMUNITY)
Admission: EM | Admit: 2011-10-24 | Discharge: 2011-10-24 | Disposition: A | Discharge status: Home / Self Care | Payer: Medicare Other | Attending: Emergency Medicine | Admitting: Emergency Medicine

## 2011-10-24 ENCOUNTER — Encounter (HOSPITAL_COMMUNITY): Payer: Self-pay | Admitting: Emergency Medicine

## 2011-10-24 DIAGNOSIS — M899 Disorder of bone, unspecified: Secondary | ICD-10-CM | POA: Insufficient documentation

## 2011-10-24 DIAGNOSIS — I10 Essential (primary) hypertension: Secondary | ICD-10-CM | POA: Insufficient documentation

## 2011-10-24 DIAGNOSIS — E785 Hyperlipidemia, unspecified: Secondary | ICD-10-CM | POA: Insufficient documentation

## 2011-10-24 HISTORY — DX: Essential (primary) hypertension: I10

## 2011-10-24 LAB — COMPREHENSIVE METABOLIC PANEL
CO2: 27 mEq/L (ref 19–32)
Calcium: 10.4 mg/dL (ref 8.4–10.5)
Chloride: 101 mEq/L (ref 96–112)
Creatinine, Ser: 0.58 mg/dL (ref 0.50–1.10)
GFR calc Af Amer: >90 mL/min (ref >90)
GFR calc non Af Amer: 85 mL/min — ABNORMAL LOW (ref >90)
Glucose, Bld: 90 mg/dL (ref 70–99)
Total Bilirubin: 0.5 mg/dL (ref 0.3–1.2)

## 2011-10-24 LAB — POCT I-STAT TROPONIN I

## 2011-10-24 LAB — CBC WITH DIFFERENTIAL/PLATELET
Eosinophils Relative: 2 % (ref 0–5)
HCT: 38.7 % (ref 36.0–46.0)
Hemoglobin: 13.5 g/dL (ref 12.0–15.0)
Lymphocytes Relative: 29 % (ref 12–46)
Lymphs Abs: 2.5 10*3/uL (ref 0.7–4.0)
MCV: 90.4 fL (ref 78.0–100.0)
Monocytes Absolute: 0.7 10*3/uL (ref 0.1–1.0)
Monocytes Relative: 8 % (ref 3–12)
RBC: 4.28 MIL/uL (ref 3.87–5.11)
WBC: 8.6 10*3/uL (ref 4.0–10.5)

## 2011-10-24 MED ORDER — CLONIDINE HCL 0.1 MG PO TABS
0.1000 mg | ORAL_TABLET | Freq: Once | ORAL | Status: AC
  Administered 2011-10-24: 0.1 mg via ORAL
  Filled 2011-10-24: qty 1

## 2011-10-24 MED ORDER — LISINOPRIL 20 MG PO TABS: 10.0000 mg | ORAL_TABLET | Freq: Every day | ORAL | Status: DC

## 2011-10-24 NOTE — ED Provider Notes (Signed)
History     CSN: [id number]  Arrival date & time 10/24/11  2019   First MD Initiated Contact with Patient 10/24/11 2148      Chief Complaint  Patient presents with  . Hypertension     HPI Patient has [facility] -- has a Teaching laboratory technician that comes by to do routine monitoring and house calls. Practioner checked pressure: first reading 230/120; second reading 224/118. Patient has history of high blood pressure; patient states that she is prescribed blood pressure medications, but she has not been taking them for the past two months. Patient asymptomatic; denies any symptoms including chest pain, shortness of breath, and nausea/vomiting. History of chest pain, hypertension, and hyperlipidemia.  Past Medical History  Diagnosis Date  . Chest pain   . Osteopenia   . Hyperlipidemia   . Menopause   . FH: colonic polyps   . Hypertension     Past Surgical History  Procedure Date  . Cholecystectomy   . Abdominal hysterectomy     Family History  Problem Relation Age of Onset  . Cancer Father     lung  . Cancer Sister     breast  . Diabetes Brother   . Cancer Daughter     thyroid    History  Substance Use Topics  . Smoking status: Never Smoker   . Smokeless tobacco: Not on file  . Alcohol Use: Yes    OB History    Grav Para Term Preterm Abortions TAB SAB Ect Mult Living                  Review of Systems  All other systems reviewed and are negative.    Allergies  Atorvastatin; Celecoxib; and Ezetimibe-simvastatin  Home Medications   Current Outpatient Rx  Name Route Sig Dispense Refill  . ANACIN PO Oral Take 1 tablet by mouth daily.    Marland Kitchen CALTRATE 600+D PO Oral Take 1 each by mouth daily.      Marland Kitchen VITAMIN B-12 CR PO Oral Take 1 each by mouth daily.      Marland Kitchen GLUCOSAMINE PO Oral Take 30 mLs by mouth daily.    Marland Kitchen VITAMIN B6 PO Oral Take 1 each by mouth daily.      Marland Kitchen LISINOPRIL 20 MG PO TABS Oral Take 0.5 tablets (10 mg total) by mouth daily. 15 tablet 0    BP  202/83  Pulse 65  Temp 98.4 F (36.9 C) (Oral)  Resp 14  SpO2 99%  Physical Exam  Nursing note and vitals reviewed. Constitutional: She is oriented to person, place, and time. She appears well-developed. No distress.  HENT:  Head: Normocephalic and atraumatic.  Eyes: Pupils are equal, round, and reactive to light.  Neck: Normal range of motion.  Cardiovascular: Normal rate and intact distal pulses.         Date: 10/24/2011  Rate: 63  Rhythm: normal sinus rhythm  QRS Axis: normal  Intervals: normal  ST/T Wave abnormalities: normal  Conduction Disutrbances: none  Narrative Interpretation: unremarkable      Pulmonary/Chest: No respiratory distress.  Abdominal: Normal appearance. She exhibits no distension.  Musculoskeletal: Normal range of motion.  Neurological: She is alert and oriented to person, place, and time. No cranial nerve deficit.  Skin: Skin is warm and dry. No rash noted.  Psychiatric: She has a normal mood and affect. Her behavior is normal.    ED Course  Procedures (including critical care time)  Labs Reviewed  COMPREHENSIVE METABOLIC PANEL -  Abnormal; Notable for the following:    GFR calc non Af Amer 85 (*)     All other components within normal limits  CBC WITH DIFFERENTIAL  POCT I-STAT TROPONIN I   No results found.   1. Hypertension       MDM         Nelia Shi, MD 10/24/11 2244

## 2011-10-24 NOTE — ED Notes (Addendum)
Patient has [facility] -- has a Teaching laboratory technician that comes by to do routine monitoring and house calls.  Practioner checked pressure: first reading 230/120; second reading 224/118.  Patient has history of high blood pressure; patient states that she is prescribed blood pressure medications, but she has not been taking them for the past two months.  Patient asymptomatic; denies any symptoms including chest pain, shortness of breath, and nausea/vomiting.  History of chest pain, hypertension, and hyperlipidemia.

## 2011-10-25 ENCOUNTER — Telehealth: Payer: Self-pay | Admitting: *Deleted

## 2011-10-25 NOTE — Telephone Encounter (Signed)
Charlean Merl, NP with [employer] called to let us know she saw the pt last night & the pt had a BP of 230/120. Pt was seen in the ER.

## 2011-10-28 NOTE — Telephone Encounter (Signed)
Called pt, she advised that she is feeling better, notes she has not checked her BP today but notes that she did start the new RX lisinopril, pt notes that the NP that came out to her home per a new service provided by her insurance company, notes that the nurse does not plan to come back out this was a one time visit as far as she understood. Pt states that the ER did not give directions to follow up with MD Drue Novel but wants to see how the medication works for her before scheduling an apt with Drue Novel, advised that MD Drue Novel is out of the office at this time but will return to the office on 11-07-11 however we have other providers that are more than willing to see her, pt still wants to wait to see how the medication is helping her before scheduling apt, this nurse advised the need for her to check her BP at home or at least in a pharmacy/[facility] etc, pt advised that she would start doing that, I noted that I will speak with PCP Tabori regarding next steps and call her back, pt understood

## 2011-10-28 NOTE — Telephone Encounter (Signed)
Pt should check her BP at least 2x/week and report #s higher than 140/90.  Should plan on OV end of Aug/early Sept regardless to determine if meds are working and/or needs to be adjusted

## 2011-10-28 NOTE — Telephone Encounter (Signed)
Called pt to advise, pt states she will start checking BP and she WILL call office if the BP is elevated as instructed, pt offered apt with provider covering for [facility] in absence, pt declined and will call office back to schedule. MD Beverely Low made aware verbally.

## 2011-10-29 ENCOUNTER — Telehealth: Payer: Self-pay | Admitting: *Deleted

## 2011-10-29 NOTE — Telephone Encounter (Signed)
Called pt to clarify pt BP as 163/78 at 7am and 166/80 at 8am, gave instructions per verbal from MD tabori to continue to take BP and if any worsening symptoms occur to please call or if after hours there is CAN to advise as well, however if BP elevates to previous numbers pt will need to go back to the ER, noted the new medication can take up to 3 weeks to assist to the ideal numbers, but the numbers at this point are not ideal however not enough concern for ER visit, pt noted her feet are hurting per ongoing issue, denied headache/chest pain, advised we will see in office tomorrow for scheduled apt concerning the BP, pt understood. MD Beverely Low made aware verbally

## 2011-10-30 ENCOUNTER — Telehealth: Payer: Self-pay | Admitting: Internal Medicine

## 2011-10-30 ENCOUNTER — Encounter: Payer: Self-pay | Admitting: Family Medicine

## 2011-10-30 ENCOUNTER — Ambulatory Visit (INDEPENDENT_AMBULATORY_CARE_PROVIDER_SITE_OTHER): Payer: Medicare Other | Admitting: Family Medicine

## 2011-10-30 VITALS — BP 151/80 | HR 70 | Temp 98.3°F | Ht 61.75 in | Wt 134.4 lb

## 2011-10-30 DIAGNOSIS — I10 Essential (primary) hypertension: Secondary | ICD-10-CM

## 2011-10-30 NOTE — Patient Instructions (Addendum)
Follow up w/ Dr Drue Novel as scheduled Continue to take the Lisinopril daily in the morning (1/2 tab) Call with any questions or concerns Hang in there!!

## 2011-10-30 NOTE — Telephone Encounter (Signed)
Pt treated by MD Tabori in office today 

## 2011-10-30 NOTE — Telephone Encounter (Signed)
If ok w/ PCP

## 2011-10-30 NOTE — Telephone Encounter (Signed)
Upon check out Patient has requested a PCP change. Both PT & Daughter state pt wants to change from PAZ to Tabori, please review and advise if ok by both providers and I can call patient back  Thanks Cb# [phone]

## 2011-10-30 NOTE — Progress Notes (Signed)
  Subjective:    Patient ID: Alicia Joseph, female    DOB: December 24, 1929, 78 y.o.   MRN: [id number]  HPI HTN- chronic problem, was 156/81 at CPE in December.  Went to ER last week due to BP 230/120 at home w/ Horticulturist, commercial.  Was not on BP meds at that time.  Normal cardiac w/u.  Pt reports she eats well and exercises regularly.  Pt reports previous med 'did not agree w/ her' (Toprol)- fatigue, dizziness.  Started Lisinopril 2 days ago.  Reports she had thoroughly cleaned house prior to BP check- 'i think it was just too much for my body'.  No HAs, N/V/D, CP, SOB above baseline, edema.   Review of Systems For ROS see HPI     Objective:   Physical Exam  Constitutional: She is oriented to person, place, and time. She appears well-developed and well-nourished. No distress.  HENT:  Head: Normocephalic and atraumatic.  Eyes: Conjunctivae and EOM are normal. Pupils are equal, round, and reactive to light.  Neck: Normal range of motion. Neck supple. No thyromegaly present.  Cardiovascular: Normal rate, regular rhythm, normal heart sounds and intact distal pulses.   No murmur heard. Pulmonary/Chest: Effort normal and breath sounds normal. No respiratory distress.  Abdominal: Soft. She exhibits no distension. There is no tenderness.  Musculoskeletal: She exhibits no edema.  Lymphadenopathy:    She has no cervical adenopathy.  Neurological: She is alert and oriented to person, place, and time.  Skin: Skin is warm and dry.  Psychiatric: She has a normal mood and affect. Her behavior is normal.          Assessment & Plan:

## 2011-10-30 NOTE — Assessment & Plan Note (Signed)
Deteriorated.  Pt's recent BP's extremely high- at stoke levels.  Pt had stopped Metoprolol that was given in December w/out alerting PCP.  Now on Lisinopril 10mg  x2 days w/out difficutly.  BP has dropped dramatically but still not at goal.  Will not titrate meds as it has only been 2 days and she is asymptomatic.  Pt to f/u in 2-3 weeks for recheck and to get BMP.  Reviewed supportive care and red flags that should prompt return.  Pt expressed understanding and is in agreement w/ plan.

## 2011-11-07 NOTE — Telephone Encounter (Signed)
Ok with me 

## 2011-11-07 NOTE — Telephone Encounter (Signed)
pcp chg approved - called pt advised she need f/u appt 2-3wk & cpe Follow up is sch 9.5.13 930am CPE in January per pt request

## 2011-11-21 ENCOUNTER — Ambulatory Visit (INDEPENDENT_AMBULATORY_CARE_PROVIDER_SITE_OTHER): Payer: Medicare Other | Admitting: Family Medicine

## 2011-11-21 ENCOUNTER — Encounter: Payer: Self-pay | Admitting: Family Medicine

## 2011-11-21 ENCOUNTER — Encounter: Payer: Self-pay | Admitting: *Deleted

## 2011-11-21 VITALS — BP 160/82 | HR 71 | Temp 98.1°F | Ht 62.0 in | Wt 137.2 lb

## 2011-11-21 DIAGNOSIS — I10 Essential (primary) hypertension: Secondary | ICD-10-CM

## 2011-11-21 LAB — BASIC METABOLIC PANEL
Calcium: 9.5 mg/dL (ref 8.4–10.5)
Creatinine, Ser: 0.7 mg/dL (ref 0.4–1.2)
GFR: 93.29 mL/min (ref >60.00)
Glucose, Bld: 90 mg/dL (ref 70–99)
Sodium: 134 mEq/L — ABNORMAL LOW (ref 135–145)

## 2011-11-21 MED ORDER — LISINOPRIL 20 MG PO TABS: 20.0000 mg | ORAL_TABLET | Freq: Every day | ORAL | Status: DC

## 2011-11-21 NOTE — Progress Notes (Signed)
  Subjective:    Patient ID: Alicia Joseph, female    DOB: [birth year], 78 y.o.   MRN: [id number]  HPI HTN- relatively new for pt, started on Lisinopril 1 month ago.  On 10 mg daily.  Denies side effects- 'i'm not feeling sick or anything'.  BP still labile and elevated: 152-184/74-91.  No CP, SOB, HAs, visual changes, edema.   Review of Systems For ROS see HPI     Objective:   Physical Exam  Vitals reviewed. Constitutional: She is oriented to person, place, and time. She appears well-developed and well-nourished. No distress.  HENT:  Head: Normocephalic and atraumatic.  Eyes: Conjunctivae and EOM are normal. Pupils are equal, round, and reactive to light.  Neck: Normal range of motion. Neck supple. No thyromegaly present.  Cardiovascular: Normal rate, regular rhythm, normal heart sounds and intact distal pulses.   No murmur heard. Pulmonary/Chest: Effort normal and breath sounds normal. No respiratory distress.  Abdominal: Soft. She exhibits no distension. There is no tenderness.  Musculoskeletal: She exhibits no edema.  Lymphadenopathy:    She has no cervical adenopathy.  Neurological: She is alert and oriented to person, place, and time.  Skin: Skin is warm and dry.  Psychiatric: She has a normal mood and affect. Her behavior is normal.          Assessment & Plan:

## 2011-11-21 NOTE — Patient Instructions (Addendum)
Follow up in 1 month to recheck blood pressure We'll notify you of your lab results The plan is to start 1 tab of Lisinopril daily Call with any questions or concerns Have a great weekend!!!

## 2011-11-21 NOTE — Assessment & Plan Note (Signed)
Remains poorly controlled.  Will check BMP and if Cr and K+ WNL will plan on increasing ACE to 20mg .  Pt tolerating med w/out difficulty and currently asymptomatic despite elevated pressure.  Will continue to follow closely.  Reviewed supportive care and red flags that should prompt return.  Pt expressed understanding and is in agreement w/ plan.

## 2011-12-04 ENCOUNTER — Encounter: Payer: Self-pay | Admitting: Family Medicine

## 2011-12-04 ENCOUNTER — Ambulatory Visit (INDEPENDENT_AMBULATORY_CARE_PROVIDER_SITE_OTHER): Payer: Medicare Other | Admitting: Family Medicine

## 2011-12-04 VITALS — BP 142/88 | HR 68 | Temp 98.3°F | Ht 63.0 in | Wt 137.0 lb

## 2011-12-04 DIAGNOSIS — R079 Chest pain, unspecified: Secondary | ICD-10-CM

## 2011-12-04 DIAGNOSIS — R0781 Pleurodynia: Secondary | ICD-10-CM

## 2011-12-04 DIAGNOSIS — I10 Essential (primary) hypertension: Secondary | ICD-10-CM

## 2011-12-04 MED ORDER — IBUPROFEN 600 MG PO TABS: 600.0000 mg | ORAL_TABLET | Freq: Three times a day (TID) | ORAL | Status: DC | PRN

## 2011-12-04 NOTE — Assessment & Plan Note (Signed)
New.  Suspect abd wall strain and deep rib bruise or possible hairline fx.  Pt declines xrays at this time and prefers to tx w/ NSAIDs, heat/ice, and wait for improvement.  Pt to call back for xray order if she changes her mind.  Will follow.

## 2011-12-04 NOTE — Progress Notes (Signed)
  Subjective:    Patient ID: Alicia Joseph, female    DOB: 12-22-1929, 78 y.o.   MRN: [id number]  HPI HTN- chronic problem, labile BPs ranging at home from 128-165/67-85.  Asymptomatic- no CP, SOB, HAs, visual changes edema.  Rib pain- started going to the gym on Saturday and was shown various new exercises.  Was using a hard plastic roller and slipped, hitting anterior lower ribs.  Is now having L sided rib pain.  Unable to sleep comfortably, unable to breathe or cough w/out pain.  No external bruising.  Very TTP.  Unable to do certain movements due to pain.   Review of Systems For ROS see HPI     Objective:   Physical Exam  Vitals reviewed. Constitutional: She is oriented to person, place, and time. She appears well-developed and well-nourished. No distress.  HENT:  Head: Normocephalic and atraumatic.  Cardiovascular: Normal rate, regular rhythm, normal heart sounds and intact distal pulses.   Pulmonary/Chest: Effort normal and breath sounds normal. No respiratory distress. She has no wheezes. She has no rales. She exhibits tenderness (over L lower ribs).  Abdominal: Soft. Bowel sounds are normal. She exhibits no distension. There is tenderness (over L superior abd wall, very superficial). There is no rebound and no guarding.  Musculoskeletal: She exhibits no edema.  Neurological: She is alert and oriented to person, place, and time.  Skin: Skin is warm and dry.  Psychiatric: She has a normal mood and affect. Her behavior is normal. Thought content normal.          Assessment & Plan:

## 2011-12-04 NOTE — Patient Instructions (Addendum)
This is likely a bruised rib w/ a strained abdominal wall Start the Ibuprofen 3x/day (w/ food) for the next 7 days Heat or ice- whichever feels better Do some gentle stretching to avoid stiffness Make sure you don't overdo it! Your BP looks great today! Call with any questions or concerns Hang in there!!

## 2011-12-04 NOTE — Assessment & Plan Note (Signed)
Improved.  Pt's home BPs much better than previous.  Currently asymptomatic.  No changes.

## 2012-01-28 ENCOUNTER — Other Ambulatory Visit: Payer: Self-pay | Admitting: Family Medicine

## 2012-01-28 DIAGNOSIS — Z1231 Encounter for screening mammogram for malignant neoplasm of breast: Secondary | ICD-10-CM

## 2012-03-09 ENCOUNTER — Other Ambulatory Visit: Payer: Self-pay | Admitting: Family Medicine

## 2012-03-09 MED ORDER — LISINOPRIL 20 MG PO TABS: 20.0000 mg | ORAL_TABLET | Freq: Every day | ORAL | Status: DC

## 2012-03-09 NOTE — Telephone Encounter (Signed)
Rx was sent to pharmacy by e-script.//AB/CMA 

## 2012-03-09 NOTE — Telephone Encounter (Signed)
refill Lisinopril (Tab) 20 MG Take 1 tablet (20 mg total) by mouth daily. #30 last fill 11.21.13

## 2012-03-23 ENCOUNTER — Ambulatory Visit (HOSPITAL_BASED_OUTPATIENT_CLINIC_OR_DEPARTMENT_OTHER): Payer: Medicare Other

## 2012-03-26 ENCOUNTER — Encounter: Payer: Self-pay | Admitting: Family Medicine

## 2012-03-26 ENCOUNTER — Ambulatory Visit (INDEPENDENT_AMBULATORY_CARE_PROVIDER_SITE_OTHER): Payer: Medicare Other | Admitting: Family Medicine

## 2012-03-26 VITALS — BP 158/98 | HR 77 | Temp 97.6°F | Ht 61.75 in | Wt 135.6 lb

## 2012-03-26 DIAGNOSIS — M949 Disorder of cartilage, unspecified: Secondary | ICD-10-CM

## 2012-03-26 DIAGNOSIS — H9190 Unspecified hearing loss, unspecified ear: Secondary | ICD-10-CM

## 2012-03-26 DIAGNOSIS — Z0000 Encounter for general adult medical examination without abnormal findings: Secondary | ICD-10-CM

## 2012-03-26 DIAGNOSIS — M899 Disorder of bone, unspecified: Secondary | ICD-10-CM

## 2012-03-26 DIAGNOSIS — E785 Hyperlipidemia, unspecified: Secondary | ICD-10-CM

## 2012-03-26 DIAGNOSIS — I10 Essential (primary) hypertension: Secondary | ICD-10-CM

## 2012-03-26 LAB — LDL CHOLESTEROL, DIRECT: Direct LDL: 173.4 mg/dL

## 2012-03-26 LAB — HEPATIC FUNCTION PANEL
ALT: 12 U/L (ref 0–35)
AST: 16 U/L (ref 0–37)
Alkaline Phosphatase: 59 U/L (ref 39–117)
Bilirubin, Direct: 0 mg/dL (ref 0.0–0.3)
Total Bilirubin: 0.6 mg/dL (ref 0.3–1.2)
Total Protein: 7.7 g/dL (ref 6.0–8.3)

## 2012-03-26 LAB — CBC WITH DIFFERENTIAL/PLATELET
Basophils Absolute: 0.1 10*3/uL (ref 0.0–0.1)
Eosinophils Absolute: 0.2 10*3/uL (ref 0.0–0.7)
Hemoglobin: 13.3 g/dL (ref 12.0–15.0)
Lymphocytes Relative: 24.9 % (ref 12.0–46.0)
MCHC: 33.1 g/dL (ref 30.0–36.0)
Monocytes Relative: 7.8 % (ref 3.0–12.0)
Neutro Abs: 4.7 10*3/uL (ref 1.4–7.7)
Neutrophils Relative %: 63.5 % (ref 43.0–77.0)
RBC: 4.31 Mil/uL (ref 3.87–5.11)
RDW: 13.2 % (ref 11.5–14.6)

## 2012-03-26 LAB — LIPID PANEL
Cholesterol: 259 mg/dL — ABNORMAL HIGH (ref 0–200)
Total CHOL/HDL Ratio: 5
VLDL: 22.2 mg/dL (ref 0.0–40.0)

## 2012-03-26 LAB — BASIC METABOLIC PANEL
CO2: 31 mEq/L (ref 19–32)
Chloride: 96 mEq/L (ref 96–112)
Creatinine, Ser: 0.6 mg/dL (ref 0.4–1.2)
Potassium: 3.8 mEq/L (ref 3.5–5.1)

## 2012-03-26 MED ORDER — LOSARTAN POTASSIUM 100 MG PO TABS: 100.0000 mg | ORAL_TABLET | Freq: Every day | ORAL | Status: DC

## 2012-03-26 NOTE — Patient Instructions (Signed)
Follow up in 1 month to recheck BP STOP the Lisinopril START the Cozaar daily We'll notify you of your lab results and mail your form Someone will call you with your hearing appt Add Mucinex DM to thin your congestion and decrease your cough Drink plenty of fluids REST! Hang in there! Happy Belated Birthday and Happy New Year!

## 2012-03-26 NOTE — Progress Notes (Signed)
  Subjective:    Patient ID: Alicia Joseph, female    DOB: [birth year], 79 y.o.   MRN: [id number]  HPI Here today for CPE.  Risk Factors: HTN- chronic problem, elevated today.  Has been taking OTC cold medicines (Nyquil, Delsym) and stopped her Lisinopril 12/20 b/c she feared a reaction.  Reports she has had ongoing cough since starting ACE.  Also had some dizziness w/ position changes.   Hyperlipidemia- chronic problem, not currently on meds.  Attempting to control w/ diet and exercise.  Physical Activity: walking regularly, playing tennis regularly Fall Risk: low risk, steady on feet Depression: denies current sxs Hearing: normal to conversational tones at 6 ft but decreased to whispered voice.  Would like to discuss hearing aides ADL's: independent Cognitive: normal linear thought process, memory and attention intact Home Safety: safe at home, lives w/ husband. Height, Weight, BMI, Visual Acuity: see vitals, vision corrected to 20/20 w/ glasses Counseling: no need for pap, s/p hysterectomy.  Due for mammo- has one scheduled.  UTD on colonoscopy 2012- was told she won't need another.  Prefers not to do DEXA Labs Ordered: See A&P Care Plan: See A&P    Review of Systems Patient reports no vision changes, adenopathy, fever, weight change,  persistant/recurrent hoarseness, swallowing issues, chest pain, palpitations, edema, persistant/recurrent cough, hemoptysis, dyspnea (rest/exertional/paroxysmal nocturnal), gastrointestinal bleeding (melena, rectal bleeding), abdominal pain, significant heartburn, bowel changes, GU symptoms (dysuria, hematuria, incontinence), Gyn symptoms (abnormal  bleeding, pain),  syncope, focal weakness, memory loss, skin/hair/nail changes, abnormal bruising or bleeding, anxiety, or depression.   + numbness and 'coldness' of hands/feet    Objective:   Physical Exam General Appearance:    Alert, cooperative, no distress, appears stated age  Head:    Normocephalic,  without obvious abnormality, atraumatic  Eyes:    PERRL, conjunctiva/corneas clear, EOM's intact, fundi    benign, both eyes  Ears:    Normal TM's and external ear canals, both ears  Nose:   Nares normal, septum midline, mucosa normal, no drainage    or sinus tenderness  Throat:   Lips, mucosa, and tongue normal; teeth and gums normal  Neck:   Supple, symmetrical, trachea midline, no adenopathy;    Thyroid: no enlargement/tenderness/nodules  Back:     Symmetric, no curvature, ROM normal, no CVA tenderness  Lungs:     Clear to auscultation bilaterally, respirations unlabored  Chest Wall:    No tenderness or deformity   Heart:    Regular rate and rhythm, S1 and S2 normal, no murmur, rub   or gallop  Breast Exam:    Deferred to mammo  Abdomen:     Soft, non-tender, bowel sounds active all four quadrants,    no masses, no organomegaly  Genitalia:    Deferred at pt's request  Rectal:    Extremities:   Extremities normal, atraumatic, no cyanosis or edema  Pulses:   2+ and symmetric all extremities  Skin:   Skin color, texture, turgor normal, no rashes or lesions  Lymph nodes:   Cervical, supraclavicular, and axillary nodes normal  Neurologic:   CNII-XII intact, normal strength, sensation and reflexes    throughout          Assessment & Plan:

## 2012-03-27 ENCOUNTER — Telehealth: Payer: Self-pay | Admitting: *Deleted

## 2012-03-27 MED ORDER — EZETIMIBE 10 MG PO TABS: 10.0000 mg | ORAL_TABLET | Freq: Every day | ORAL | Status: DC

## 2012-03-27 NOTE — Telephone Encounter (Signed)
Message copied by Verdie Shire on Fri Mar 27, 2012  2:51 PM ------      Message from: Sheliah Hatch      Created: Fri Mar 27, 2012  1:13 PM       Labs look great w/ exception of lipids- pt has Zetia listed on allergy list.  Please ask if this is accurate.      Please complete physical form and mail.

## 2012-03-27 NOTE — Telephone Encounter (Signed)
Spoke with the pt and informed her of recent lab results and note.  Pt stated that she's not sure if she's had any problems with the Zetia.  Informed the pt it was on her allergy list.  Pt stated she's willing to try the Zetia.  Informed Dr. Beverely Low of this and was informed to sent in a new rx for the Zetia.  Rx sent to the pharmacy by e-script.  Pt asked about the new BP med that she was to be started on.  Informed her the new rx for the BP was sent to her pharmacy.  Also informed her the complete physical form is f/o and put in the mail.   Pt agreed.//AB/CMA

## 2012-03-29 LAB — VITAMIN D 1,25 DIHYDROXY
Vitamin D 1, 25 (OH)2 Total: 38 pg/mL (ref 18–72)
Vitamin D2 1, 25 (OH)2: <8 pg/mL
Vitamin D3 1, 25 (OH)2: 38 pg/mL

## 2012-03-31 ENCOUNTER — Encounter: Payer: Self-pay | Admitting: *Deleted

## 2012-03-31 NOTE — Assessment & Plan Note (Signed)
Check Vit D level.  Pt UTD on DEXA.

## 2012-03-31 NOTE — Assessment & Plan Note (Signed)
Deteriorated.  Pt stopped BP med and reports she had ongoing cough associated w/ this.  Will switch to ARB and follow closely for adequate BP control.  Pt expressed understanding and is in agreement w/ plan.

## 2012-03-31 NOTE — Assessment & Plan Note (Addendum)
Chronic problem.  Not currently on med- intolerant to statin in past.  Check labs.  Start meds prn

## 2012-03-31 NOTE — Assessment & Plan Note (Signed)
Pt's PE WNL.  UTD on health maintenance (mammo scheduled).  Check labs.  Anticipatory guidance provided.

## 2012-04-07 ENCOUNTER — Ambulatory Visit (HOSPITAL_BASED_OUTPATIENT_CLINIC_OR_DEPARTMENT_OTHER): Payer: Medicare Other

## 2012-04-08 ENCOUNTER — Ambulatory Visit (HOSPITAL_BASED_OUTPATIENT_CLINIC_OR_DEPARTMENT_OTHER): Payer: Medicare Other

## 2012-04-08 ENCOUNTER — Ambulatory Visit (HOSPITAL_BASED_OUTPATIENT_CLINIC_OR_DEPARTMENT_OTHER)
Admission: RE | Admit: 2012-04-08 | Discharge: 2012-04-08 | Disposition: A | Discharge status: Home / Self Care | Payer: Medicare Other | Source: Ambulatory Visit | Attending: Family Medicine | Admitting: Family Medicine

## 2012-04-08 ENCOUNTER — Telehealth: Payer: Self-pay | Admitting: Family Medicine

## 2012-04-08 DIAGNOSIS — Z1231 Encounter for screening mammogram for malignant neoplasm of breast: Secondary | ICD-10-CM | POA: Insufficient documentation

## 2012-04-08 NOTE — Telephone Encounter (Signed)
At time of physical, pt mentioned decreased hearing and the idea of hearing aides.  Wanted a referral to an audiologist that would not 'sell me something i don't need'.  Told her I would take care of that for her.  I'm sorry if she doesn't remember this part of the visit but there is no need to accuse anyone of lying- that is not acceptable.

## 2012-04-08 NOTE — Telephone Encounter (Signed)
In reference to Audiology referral to [facility] entered on 03/26/12, when they called patient to schedule, she declined. Per call from [facility], when they contacted patient about her appointment, she was very upset, stated she knew nothing about the referral, and had not discussed hearing loss with anyone.  Patient's husband, was also on the phone, and was calling the scheduler a "liar".

## 2012-08-24 ENCOUNTER — Other Ambulatory Visit: Payer: Self-pay | Admitting: Family Medicine

## 2012-08-25 NOTE — Telephone Encounter (Signed)
Rx sent to the pharmacy by e-script.   Note sent to the pharmacy that the pt need an OV for BP check.//AB/CMA

## 2012-08-27 ENCOUNTER — Ambulatory Visit (INDEPENDENT_AMBULATORY_CARE_PROVIDER_SITE_OTHER): Payer: Medicare Other | Admitting: Family Medicine

## 2012-08-27 ENCOUNTER — Encounter: Payer: Self-pay | Admitting: Family Medicine

## 2012-08-27 VITALS — BP 170/90 | HR 69 | Temp 98.0°F | Ht 61.75 in | Wt 135.6 lb

## 2012-08-27 DIAGNOSIS — I10 Essential (primary) hypertension: Secondary | ICD-10-CM

## 2012-08-27 DIAGNOSIS — M62838 Other muscle spasm: Secondary | ICD-10-CM

## 2012-08-27 MED ORDER — MELOXICAM 7.5 MG PO TABS: 7.5000 mg | ORAL_TABLET | Freq: Every day | ORAL | Status: DC

## 2012-08-27 MED ORDER — EZETIMIBE 10 MG PO TABS: 10.0000 mg | ORAL_TABLET | Freq: Every day | ORAL | Status: DC

## 2012-08-27 MED ORDER — TIZANIDINE HCL 2 MG PO CAPS: 2.0000 mg | ORAL_CAPSULE | Freq: Three times a day (TID) | ORAL | Status: DC

## 2012-08-27 NOTE — Patient Instructions (Addendum)
Follow up in 1 month to recheck BP Start the Mobic daily for inflammation Use the Zanaflex for spasm- will cause drowsiness Heating pad for pain and spasm relief Call with any questions or concerns Hang in there!!
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
## 2012-08-28 ENCOUNTER — Encounter: Payer: Self-pay | Admitting: *Deleted

## 2012-08-28 LAB — BASIC METABOLIC PANEL
BUN: 14 mg/dL (ref 6–23)
Calcium: 9.2 mg/dL (ref 8.4–10.5)
Chloride: 101 mEq/L (ref 96–112)
Creatinine, Ser: 0.6 mg/dL (ref 0.4–1.2)

## 2012-09-03 ENCOUNTER — Telehealth: Payer: Self-pay | Admitting: Family Medicine

## 2012-09-03 NOTE — Telephone Encounter (Signed)
Discuss with patient  

## 2012-09-03 NOTE — Telephone Encounter (Signed)
No additional instructions.  She can alternate heat/ice and rest.  Hopefully that will improve her muscle soreness

## 2012-09-03 NOTE — Telephone Encounter (Signed)
Pt indicated that med are not helping to relax muscle or helping her to sleep.Pt is c/o muscle ache on right side and being sleepless. Pt state that she read that meloxicam could cause mouth inflamation and she is currently having issue with her mouth being sore which she feels is related to the med. Pt has since stopped meds today. Pt is not interesting in starting anything else at this time. Pt indicated that she would just like for her body to have some recovery time. Pt just wanted to inform PCP of med stopped and would like to know if she has any suggestion for her.Please advise   Pt uses [facility]

## 2012-09-03 NOTE — Telephone Encounter (Signed)
Patient states she has stopped taking meloxicam and tizandidine. She states they were "aggravating her condition." Call pt back @ [phone]

## 2012-09-13 NOTE — Assessment & Plan Note (Signed)
Deteriorated.  Pt thinks this is due to pain level today.  Asymptomatic.  Pt to return in 1 month for recheck to ensure normal readings.  Will follow.

## 2012-09-13 NOTE — Assessment & Plan Note (Signed)
New.  Start scheduled NSAIDs and low dose muscle relaxer.  Suspect this is all due to overexertion.  Heat, gentle stretching.  Will follow.

## 2012-09-22 ENCOUNTER — Ambulatory Visit (INDEPENDENT_AMBULATORY_CARE_PROVIDER_SITE_OTHER): Payer: Medicare Other | Admitting: Family Medicine

## 2012-09-22 ENCOUNTER — Encounter: Payer: Self-pay | Admitting: Family Medicine

## 2012-09-22 VITALS — BP 140/70 | HR 71 | Temp 98.0°F | Ht 61.75 in | Wt 134.6 lb

## 2012-09-22 DIAGNOSIS — I10 Essential (primary) hypertension: Secondary | ICD-10-CM

## 2012-09-22 NOTE — Progress Notes (Signed)
  Subjective:    Patient ID: Alicia Joseph, female    DOB: December 27, 1929, 79 y.o.   MRN: [id number]  HPI HTN- chronic problem.  BP was elevated at last visit due to pain.  Much improved today.  On Losartan daily.  No CP, SOB, HAs, visual changes, edema.     Review of Systems For ROS see HPI     Objective:   Physical Exam  Vitals reviewed. Constitutional: She is oriented to person, place, and time. She appears well-developed and well-nourished. No distress.  HENT:  Head: Normocephalic and atraumatic.  Eyes: Conjunctivae and EOM are normal. Pupils are equal, round, and reactive to light.  Neck: Normal range of motion. Neck supple. No thyromegaly present.  Cardiovascular: Normal rate, regular rhythm, normal heart sounds and intact distal pulses.   No murmur heard. Pulmonary/Chest: Effort normal and breath sounds normal. No respiratory distress.  Abdominal: Soft. She exhibits no distension. There is no tenderness.  Musculoskeletal: She exhibits no edema.  Lymphadenopathy:    She has no cervical adenopathy.  Neurological: She is alert and oriented to person, place, and time.  Skin: Skin is warm and dry.  Psychiatric: She has a normal mood and affect. Her behavior is normal.          Assessment & Plan:

## 2012-09-22 NOTE — Assessment & Plan Note (Signed)
Improved now that pt is no longer in pain.  Asymptomatic.  Reviewed metabolic panel from last visit.  No med changes.  Will continue to follow at future visits.

## 2012-09-22 NOTE — Patient Instructions (Addendum)
Schedule your complete physical for January Keep up the good work!  You look great! Call with any questions or concerns Have a great summer!!!

## 2012-09-24 ENCOUNTER — Other Ambulatory Visit: Payer: Medicare Other | Admitting: Family Medicine

## 2012-09-29 ENCOUNTER — Other Ambulatory Visit: Payer: Self-pay | Admitting: Family Medicine

## 2012-12-28 ENCOUNTER — Other Ambulatory Visit: Payer: Self-pay | Admitting: Family Medicine

## 2012-12-28 ENCOUNTER — Ambulatory Visit (INDEPENDENT_AMBULATORY_CARE_PROVIDER_SITE_OTHER): Payer: Medicare Other | Admitting: *Deleted

## 2012-12-28 DIAGNOSIS — Z23 Encounter for immunization: Secondary | ICD-10-CM

## 2012-12-28 MED ORDER — LOSARTAN POTASSIUM 100 MG PO TABS: ORAL_TABLET | ORAL | Status: DC

## 2012-12-28 NOTE — Telephone Encounter (Signed)
Med filled.  

## 2013-01-22 ENCOUNTER — Telehealth: Payer: Self-pay | Admitting: *Deleted

## 2013-01-22 NOTE — Telephone Encounter (Signed)
Patient made aware of recommendations and an appointment was made for Monday.

## 2013-01-22 NOTE — Telephone Encounter (Signed)
Patient is complaining of dizziness. Patient thinks it is coming from the blood pressure Losartan. Patient also states that she has not done anything different. Patient has stop he blood pressure medication just to see if that's the problem. Patient states that she wanted to know if she should come in to be seen or wait it to see if the symptoms are still there.

## 2013-01-22 NOTE — Telephone Encounter (Signed)
Pt can stop BP meds for the weekend and see if the dizziness improves but will need appt for early next week to recheck BP and dizziness

## 2013-01-25 ENCOUNTER — Encounter: Payer: Self-pay | Admitting: Family Medicine

## 2013-01-25 ENCOUNTER — Ambulatory Visit (INDEPENDENT_AMBULATORY_CARE_PROVIDER_SITE_OTHER): Payer: Medicare Other | Admitting: Family Medicine

## 2013-01-25 VITALS — BP 166/86 | HR 72 | Temp 97.9°F | Resp 16 | Wt 135.0 lb

## 2013-01-25 DIAGNOSIS — I10 Essential (primary) hypertension: Secondary | ICD-10-CM

## 2013-01-25 DIAGNOSIS — H8110 Benign paroxysmal vertigo, unspecified ear: Secondary | ICD-10-CM

## 2013-01-25 MED ORDER — MECLIZINE HCL 12.5 MG PO TABS: 12.5000 mg | ORAL_TABLET | Freq: Three times a day (TID) | ORAL | Status: DC | PRN

## 2013-01-25 NOTE — Assessment & Plan Note (Signed)
Deteriorated since stopping meds.  Asymptomatic but will restart Losartan to improve BP.  Pt expressed understanding and is in agreement w/ plan.

## 2013-01-25 NOTE — Progress Notes (Signed)
  Subjective:    Patient ID: Alicia Joseph, female    DOB: 1929/08/21, 79 y.o.   MRN: [id number]  HPI Pre visit review using our clinic review tool, if applicable. No additional management support is needed unless otherwise documented below in the visit note.  HTN- pt reports she was having dizziness last week and felt that it was BP med.  Stopped Losartan and symptoms somewhat improved but still having dizziness.  Pt reports dizziness is worse w/ lying down 'head is swimming'.  No CP, SOB, HAs, visual changes, edema.  Dizziness also occurs w/ looking upward, rapid turning of head.  Denies nausea/vomiting.   Review of Systems For ROS see HPI     Objective:   Physical Exam  Vitals reviewed. Constitutional: She is oriented to person, place, and time. She appears well-developed and well-nourished. No distress.  HENT:  Head: Normocephalic and atraumatic.  Eyes: Conjunctivae and EOM are normal. Pupils are equal, round, and reactive to light.  Neck: Normal range of motion. Neck supple. No thyromegaly present.  Cardiovascular: Normal rate, regular rhythm, normal heart sounds and intact distal pulses.   No murmur heard. Pulmonary/Chest: Effort normal and breath sounds normal. No respiratory distress.  Abdominal: Soft. She exhibits no distension. There is no tenderness.  Musculoskeletal: She exhibits no edema.  Lymphadenopathy:    She has no cervical adenopathy.  Neurological: She is alert and oriented to person, place, and time. She has normal reflexes. No cranial nerve deficit. Coordination normal.  Skin: Skin is warm and dry.  Psychiatric: She has a normal mood and affect. Her behavior is normal.          Assessment & Plan:

## 2013-01-25 NOTE — Assessment & Plan Note (Signed)
New.  Start meclizine prn.  Change positions slowly, increase fluid intake.  Reviewed supportive care and red flags that should prompt return.  Pt expressed understanding and is in agreement w/ plan.

## 2013-01-25 NOTE — Patient Instructions (Signed)
Schedule your complete physical for after Mar 26, 2013 Restart the Losartan daily Use the meclizine as needed for dizziness Change positions slowly Drink plenty of fluids Call with any questions or concerns Hang in there!! Happy Holidays!

## 2013-03-29 ENCOUNTER — Telehealth: Payer: Self-pay

## 2013-03-29 NOTE — Telephone Encounter (Signed)
Left message with spouse about appt..   CCS- 01/26/2012 MMG-04/08/12 Flu-12/28/12 Tdap-02/27/09 PNA- 01/14/2007 Z- 06/05/2006

## 2013-03-30 ENCOUNTER — Ambulatory Visit (INDEPENDENT_AMBULATORY_CARE_PROVIDER_SITE_OTHER): Payer: Medicare Other | Admitting: Family Medicine

## 2013-03-30 ENCOUNTER — Encounter: Payer: Self-pay | Admitting: Family Medicine

## 2013-03-30 VITALS — BP 132/78 | HR 67 | Temp 98.4°F | Resp 16 | Wt 134.1 lb

## 2013-03-30 DIAGNOSIS — M899 Disorder of bone, unspecified: Secondary | ICD-10-CM

## 2013-03-30 DIAGNOSIS — M949 Disorder of cartilage, unspecified: Secondary | ICD-10-CM

## 2013-03-30 DIAGNOSIS — H9190 Unspecified hearing loss, unspecified ear: Secondary | ICD-10-CM

## 2013-03-30 DIAGNOSIS — I10 Essential (primary) hypertension: Secondary | ICD-10-CM

## 2013-03-30 DIAGNOSIS — E785 Hyperlipidemia, unspecified: Secondary | ICD-10-CM

## 2013-03-30 DIAGNOSIS — Z0000 Encounter for general adult medical examination without abnormal findings: Secondary | ICD-10-CM

## 2013-03-30 LAB — CBC WITH DIFFERENTIAL/PLATELET
BASOS ABS: 0.1 10*3/uL (ref 0.0–0.1)
Basophils Relative: 1 % (ref 0.0–3.0)
Eosinophils Absolute: 0.2 10*3/uL (ref 0.0–0.7)
Eosinophils Relative: 2.7 % (ref 0.0–5.0)
HEMATOCRIT: 40 % (ref 36.0–46.0)
HEMOGLOBIN: 13.1 g/dL (ref 12.0–15.0)
LYMPHS ABS: 1.6 10*3/uL (ref 0.7–4.0)
Lymphocytes Relative: 23.3 % (ref 12.0–46.0)
MCHC: 32.7 g/dL (ref 30.0–36.0)
MCV: 93.9 fl (ref 78.0–100.0)
MONOS PCT: 7.9 % (ref 3.0–12.0)
Monocytes Absolute: 0.5 10*3/uL (ref 0.1–1.0)
NEUTROS ABS: 4.5 10*3/uL (ref 1.4–7.7)
Neutrophils Relative %: 65.1 % (ref 43.0–77.0)
Platelets: 253 10*3/uL (ref 150.0–400.0)
RBC: 4.26 Mil/uL (ref 3.87–5.11)
RDW: 13.9 % (ref 11.5–14.6)
WBC: 6.8 10*3/uL (ref 4.5–10.5)

## 2013-03-30 LAB — HEPATIC FUNCTION PANEL
ALBUMIN: 3.9 g/dL (ref 3.5–5.2)
ALT: 13 U/L (ref 0–35)
AST: 14 U/L (ref 0–37)
Alkaline Phosphatase: 63 U/L (ref 39–117)
BILIRUBIN DIRECT: 0 mg/dL (ref 0.0–0.3)
TOTAL PROTEIN: 7.6 g/dL (ref 6.0–8.3)
Total Bilirubin: 0.5 mg/dL (ref 0.3–1.2)

## 2013-03-30 LAB — BASIC METABOLIC PANEL
BUN: 11 mg/dL (ref 6–23)
CALCIUM: 9.2 mg/dL (ref 8.4–10.5)
CO2: 26 meq/L (ref 19–32)
CREATININE: 0.6 mg/dL (ref 0.4–1.2)
Chloride: 101 mEq/L (ref 96–112)
GFR: 108.18 mL/min (ref >60.00)
Glucose, Bld: 97 mg/dL (ref 70–99)
Potassium: 4.1 mEq/L (ref 3.5–5.1)
Sodium: 134 mEq/L — ABNORMAL LOW (ref 135–145)

## 2013-03-30 LAB — LDL CHOLESTEROL, DIRECT: LDL DIRECT: 151.6 mg/dL

## 2013-03-30 LAB — LIPID PANEL
CHOL/HDL RATIO: 3
CHOLESTEROL: 220 mg/dL — AB (ref 0–200)
HDL: 68.5 mg/dL (ref >39.00)
TRIGLYCERIDES: 69 mg/dL (ref 0.0–149.0)
VLDL: 13.8 mg/dL (ref 0.0–40.0)

## 2013-03-30 LAB — TSH: TSH: 1.3 u[IU]/mL (ref 0.35–5.50)

## 2013-03-30 NOTE — Progress Notes (Signed)
Pre visit review using our clinic review tool, if applicable. No additional management support is needed unless otherwise documented below in the visit note. 

## 2013-03-30 NOTE — Assessment & Plan Note (Signed)
Pt's PE WNL.  UTD on health maintenance w/ exception of DEXA- order entered.  Check labs.  Anticipatory guidance provided.  

## 2013-03-30 NOTE — Assessment & Plan Note (Signed)
Chronic problem.  Due for DEXA.  Order entered.  Check Vit D level.

## 2013-03-30 NOTE — Assessment & Plan Note (Signed)
Chronic problem.  Tolerating Zetia w/out difficulty.  Check labs.  Adjust meds prn

## 2013-03-30 NOTE — Progress Notes (Signed)
   Subjective:    Patient ID: Alicia Joseph, female    DOB: 10-17-1929, 80 y.o.   MRN: [id number]  HPI Here today for CPE.  Risk Factors: HTN- chronic problem, on losartan.  Well controlled.  Denies CP, SOB, HAs, visual changes, edema. Hyperlipidemia- chronic problem, on Zetia.  Denies abd pain, N/V, myalgias. Osteopenia- overdue on DEXA, taking Ca and Vit D daily  Physical Activity: exercising regularly, 3x/week at the [facility], playing tennis Fall Risk: moderate risk- fell cleaning gutters fall 2014 Depression: pt admits to some depressive sxs but 'i'm fighting it and working through it'. Hearing: decreased to conversational tones and whispered voice ADL's: independent Cognitive: normal linear thought process, memory and attention intact Home Safety: safe at home, lives w/ husband Height, Weight, BMI, Visual Acuity: see vitals, vision corrected to 20/20 w/ glasses Counseling: UTD on colonoscopy, mammo.  Due for DEXA.  No need for paps. Labs Ordered: See A&P Care Plan: See A&P    Review of Systems Patient reports no vision changes, adenopathy,fever, weight change,  persistant/recurrent hoarseness , swallowing issues, chest pain, palpitations, edema, persistant/recurrent cough, hemoptysis, dyspnea (rest/exertional/paroxysmal nocturnal), gastrointestinal bleeding (melena, rectal bleeding), abdominal pain, significant heartburn, bowel changes, GU symptoms (dysuria, hematuria, incontinence), Gyn symptoms (abnormal  bleeding, pain),  syncope, focal weakness, memory loss, numbness & tingling, skin/hair/nail changes, abnormal bruising or bleeding.     Objective:   Physical Exam  General Appearance:    Alert, cooperative, no distress, appears stated age  Head:    Normocephalic, without obvious abnormality, atraumatic  Eyes:    PERRL, conjunctiva/corneas clear, EOM's intact, fundi    benign, both eyes  Ears:    Normal TM's and external ear canals, both ears  Nose:   Nares normal, septum  midline, mucosa normal, no drainage    or sinus tenderness  Throat:   Lips, mucosa, and tongue normal; teeth and gums normal  Neck:   Supple, symmetrical, trachea midline, no adenopathy;    Thyroid: no enlargement/tenderness/nodules  Back:     Symmetric, no curvature, ROM normal, no CVA tenderness  Lungs:     Clear to auscultation bilaterally, respirations unlabored  Chest Wall:    No tenderness or deformity   Heart:    Regular rate and rhythm, S1 and S2 normal, no murmur, rub   or gallop  Breast Exam:    No tenderness, masses, or nipple abnormality  Abdomen:     Soft, non-tender, bowel sounds active all four quadrants,    no masses, no organomegaly  Genitalia:    deferred  Rectal:    Extremities:   Extremities normal, atraumatic, no cyanosis or edema  Pulses:   2+ and symmetric all extremities  Skin:   Skin color, texture, turgor normal, no rashes or lesions  Lymph nodes:   Cervical, supraclavicular, and axillary nodes normal  Neurologic:   CNII-XII intact, normal strength, sensation and reflexes    throughout          Assessment & Plan:

## 2013-03-30 NOTE — Assessment & Plan Note (Signed)
Chronic problem.  Well controlled.  Asymptomatic.  Check labs- no anticipated med changes.

## 2013-03-30 NOTE — Patient Instructions (Signed)
Follow up in 6 months to recheck BP- sooner if needed We'll call you with your audiology and bone density appt We'll notify you of your lab results and make any changes if needed Keep up the good work!  You look great! Call with any questions or concerns Hang in there!! Happy New Year!!

## 2013-03-30 NOTE — Telephone Encounter (Signed)
Unable to reach pre visit.  

## 2013-03-31 ENCOUNTER — Encounter: Payer: Self-pay | Admitting: General Practice

## 2013-04-03 LAB — VITAMIN D 1,25 DIHYDROXY
Vitamin D 1, 25 (OH)2 Total: 39 pg/mL (ref 18–72)
Vitamin D2 1, 25 (OH)2: <8 pg/mL
Vitamin D3 1, 25 (OH)2: 39 pg/mL

## 2013-04-13 ENCOUNTER — Ambulatory Visit (INDEPENDENT_AMBULATORY_CARE_PROVIDER_SITE_OTHER)
Admission: RE | Admit: 2013-04-13 | Discharge: 2013-04-13 | Disposition: A | Discharge status: Home / Self Care | Payer: Medicare Other | Source: Ambulatory Visit | Attending: Family Medicine | Admitting: Family Medicine

## 2013-04-13 DIAGNOSIS — M949 Disorder of cartilage, unspecified: Principal | ICD-10-CM

## 2013-04-13 DIAGNOSIS — M899 Disorder of bone, unspecified: Secondary | ICD-10-CM

## 2013-04-21 ENCOUNTER — Telehealth: Payer: Self-pay | Admitting: Family Medicine

## 2013-04-21 NOTE — Telephone Encounter (Signed)
Relevant patient education mailed to patient.  

## 2013-04-28 ENCOUNTER — Other Ambulatory Visit: Payer: Self-pay | Admitting: Family Medicine

## 2013-04-28 ENCOUNTER — Ambulatory Visit: Payer: Medicare Other | Attending: Family Medicine | Admitting: Audiology

## 2013-04-28 DIAGNOSIS — H903 Sensorineural hearing loss, bilateral: Secondary | ICD-10-CM | POA: Insufficient documentation

## 2013-04-28 DIAGNOSIS — H93213 Auditory recruitment, bilateral: Secondary | ICD-10-CM

## 2013-04-28 NOTE — Telephone Encounter (Signed)
Med filled.  

## 2013-04-28 NOTE — Patient Instructions (Addendum)
[employer] in [place] may help with obtaining one hearing aid or one captioned telephone if financial qualifications are met.    Please contact Miss Sharlett Iles at [phone] ext [phone].  List of Providers for [employer] [facility] September 16, 2011 - September 14, 2013. The following hearing aid companies have contracted with [place] to fit hearing aids for eligible applicants. Representatives of these companies are not [facility] employees. Inclusion on this list means the company agrees to the terms of contract pricing and inclusion is not an endorsement of their services over those who are not contracted with the division. An applicant may choose any provider listed from your region to assist in obtaining hearing aids through this service. If problems arise during this process, please contact the [place] that provided the application.  [facility] Vendor List 1 Updated; July 13, 2012   [facility] [place], [place] [facility] [address] [address] [place], [facility] ENT  [place]  [phone] and [place]  [phone] [place]  [phone] [place] [facility] [place]  [phone] [place] [phone], [phone],  [place]  [phone]  [employer] hearing aids      Strategies that help improve hearing include: A) Face the speaker directly. Optimal is having the speakers face well - lit.  Unless amplified, being within 3-6 feet of the speaker will enhance word recognition. B) Avoid having the speaker back-lit as this will minimize the ability to use cues from lip-reading, facial expression and gestures. C)  Word recognition is poorer in background noise. For optimal word recognition, turn off  the TV, radio or noisy fan when engaging in conversation. In a restaurant, try to sit away from noise sources and close to the primary speaker.  D)  Ask for topic clarification from time to time in order to remain in the conversation.  Most people don't mind repeating or clarifying a point when asked.  If needed, explain the difficulty hearing in background noise or hearing loss.    Hearing Loss A hearing loss is sometimes called deafness. Hearing loss may be partial or total. CAUSES Hearing loss may be caused by:  Wax in the ear canal.  Infection of the ear canal.  Infection of the middle ear.  Trauma to the ear or surrounding area.  Fluid in the middle ear.  A hole in the eardrum (perforated eardrum).  Exposure to loud sounds or music.  Problems with the hearing nerve.  Certain medications. Hearing loss without wax, infection, or a history of injury may mean that the nerve is involved. Hearing loss with severe dizziness, nausea and vomiting or ringing in the ear may suggest a hearing nerve irritation or problems in the middle or inner ear. If hearing loss is untreated, there is a greater likelihood for residual or permanent hearing loss. DIAGNOSIS A hearing test (audiometry) assesses hearing loss. The audiometry test needs to be performed by a hearing specialist (audiologist). TREATMENT Treatment for recent onset of hearing loss may include:  Ear wax removal.  Medications that kill germs (antibiotics).  Cortisone medications.  Prompt follow up with the appropriate specialist. Return of hearing depends on the cause of your hearing loss, so proper medical follow-up is important. Some hearing loss may not be reversible, and a  caregiver should discuss care and treatment options with you. SEEK MEDICAL CARE IF:   You have a severe headache, dizziness, or changes in vision.  You have new or increased weakness.  You develop repeated vomiting or other serious medical  problems.  You have a fever. Document Released: 03/04/2005 Document Revised: 05/27/2011 Document Reviewed: 06/29/2009 [facility] Patient Information 2014 [place], [place].

## 2013-04-28 NOTE — Procedures (Signed)
[facility] [address] [place], [place] [address] [place] EVALUATION  Name: ALTAIR STANKO DOB:  [birth year] MRN:  [id number]                                 Diagnosis: Hearing loss Date: 04/28/2013    Referent: Annye Asa, MD  HISTORY: LEENA TIEDE, age 80 y.o. years, was seen for an audiological evaluation and reports "very poor hearing in her ears for the past several years" and currently has "trouble hearing TV, the telephone and hearing in large gathering".  She denies tinnitus or vertigo.  Mrs. Dejonge reports "bomb exposure as a child in [place] during WWII".  For the past year she has been on blood pressure mediation.    EVALUATION: Pure tone air and tone conduction was completed using conventional audiometry with inserts. Hearing thresholds are symmetrical and are 55-60 dBHL from 250Hz  - 500Hz  improving to 40 dBHL at 2000Hz  and dropping to 55 dBHL at 4000Hz  and 65-70 dBHL at 8000Hz . Speech reception thresholds are 50 dBHL in the right ear and 50 dBHL in the left ear using recorded spondee words.  The reliability is good. Word recognition is 92% at 75dBHL in the right and 96% at 75dBHL in the left using recorded NU-6 word lists in quiet. Mrs. Booher was unable to repeat any words at +5 dBHL signal to noise ratio today, but in minimal background noise with +10dB signal to noise ratio word recognition is 56 % in the right ear and 42% in the left ear. Otoscopic inspection reveals clear ear canals with visible tympanic membranes.  Tympanometry showed was within normal limits but slightly shallow bilaterally (Type As).    CONCLUSION:      SALOMA CADENA has a moderately severe sensorineural hearing loss in the low frequencies that improves to a mild loss at 2000Hz  and then drops off in the high frequencies to moderately severe again.  There is significant recruitment bilaterally.  Mrs. Stepney has excellent word recognition in quiet  that becomes poor in very minimal background noise in each ear.  Her middle ear function is within normal limits.  Mrs. Lightle would benefit from bilateral amplification and several options were discussed with her.  In addition, because of her difficulty understanding on the telephone because of loudness, word recognition and accent difficulties a captioned telephone (such as CAPTEL) was recommended. Please note that prior to hearing aid fitting [place] requires clearance by an ENT or that the patient sign a waiver indicating that they wish to proceed with the hearing aid fitting.  A hearing aid trial period is also required in [place] for the patient protection.  RECOMMENDATIONS: 1.   Monitor hearing closely with a repeat audiological evaluation in 3-6 months (earlier if there is any change in hearing). 2.   Consider captioned telephone. Please be aware that [employer] in [facility] may help with obtaining one hearing aid or one captioned telephone if financial qualifications are met.  Please contact Miss Sharlett Iles at [phone] ext [phone].             For amplification, these are the providers in our area that work with [employer].  Updated; July 13, 2012  [facility] [place], [place]  [facility] [address] [place], [place]  [facility] ENT  [place]  [phone] and [place]  [place]  [phone]  [place] [facility] [place]  [phone]  [facility] [place] [facility] [place] [phone], [phone],   [place]  [phone] 3.  Please be aware that Ear, Nose and Throat physicians as well as [employer] also dispense hearing aids. 4.  Strategies that help improve hearing include: A) Face the speaker directly. Optimal is having the speakers  face well - lit.  Unless amplified, being within 3-6 feet of the speaker will enhance word recognition. B) Avoid having the speaker back-lit as this will minimize the ability to use cues from lip-reading, facial expression and gestures. C)  Word recognition is poorer in background noise. For optimal word recognition, turn off the TV, radio or noisy fan when engaging in conversation. In a restaurant, try to sit away from noise sources and close to the primary speaker.  D)  Ask for topic clarification from time to time in order to remain in the conversation.  Most people don't mind repeating or clarifying a point when asked.  If needed, explain the difficulty hearing in background noise or hearing loss.  Deborah L. Heide Spark, Au.D., CCC-A Doctor of Audiology 04/28/2013

## 2013-06-02 ENCOUNTER — Other Ambulatory Visit: Payer: Self-pay | Admitting: Family Medicine

## 2013-06-02 DIAGNOSIS — Z1231 Encounter for screening mammogram for malignant neoplasm of breast: Secondary | ICD-10-CM

## 2013-06-07 ENCOUNTER — Ambulatory Visit (HOSPITAL_BASED_OUTPATIENT_CLINIC_OR_DEPARTMENT_OTHER)
Admission: RE | Admit: 2013-06-07 | Discharge: 2013-06-07 | Disposition: A | Discharge status: Home / Self Care | Payer: Medicare Other | Source: Ambulatory Visit | Attending: Family Medicine | Admitting: Family Medicine

## 2013-06-07 DIAGNOSIS — Z1231 Encounter for screening mammogram for malignant neoplasm of breast: Secondary | ICD-10-CM | POA: Insufficient documentation

## 2013-06-19 ENCOUNTER — Emergency Department (HOSPITAL_BASED_OUTPATIENT_CLINIC_OR_DEPARTMENT_OTHER): Payer: Medicare Other

## 2013-06-19 ENCOUNTER — Encounter (HOSPITAL_BASED_OUTPATIENT_CLINIC_OR_DEPARTMENT_OTHER): Payer: Self-pay | Admitting: Emergency Medicine

## 2013-06-19 ENCOUNTER — Emergency Department (HOSPITAL_BASED_OUTPATIENT_CLINIC_OR_DEPARTMENT_OTHER)
Admission: EM | Admit: 2013-06-19 | Discharge: 2013-06-19 | Disposition: A | Discharge status: Home / Self Care | Payer: Medicare Other | Attending: Emergency Medicine | Admitting: Emergency Medicine

## 2013-06-19 DIAGNOSIS — S022XXA Fracture of nasal bones, initial encounter for closed fracture: Secondary | ICD-10-CM | POA: Insufficient documentation

## 2013-06-19 DIAGNOSIS — I10 Essential (primary) hypertension: Secondary | ICD-10-CM | POA: Insufficient documentation

## 2013-06-19 DIAGNOSIS — Z8639 Personal history of other endocrine, nutritional and metabolic disease: Secondary | ICD-10-CM | POA: Insufficient documentation

## 2013-06-19 DIAGNOSIS — W108XXA Fall (on) (from) other stairs and steps, initial encounter: Secondary | ICD-10-CM | POA: Insufficient documentation

## 2013-06-19 DIAGNOSIS — W19XXXA Unspecified fall, initial encounter: Secondary | ICD-10-CM

## 2013-06-19 DIAGNOSIS — Y929 Unspecified place or not applicable: Secondary | ICD-10-CM | POA: Insufficient documentation

## 2013-06-19 DIAGNOSIS — Z7982 Long term (current) use of aspirin: Secondary | ICD-10-CM | POA: Insufficient documentation

## 2013-06-19 DIAGNOSIS — W1809XA Striking against other object with subsequent fall, initial encounter: Secondary | ICD-10-CM | POA: Insufficient documentation

## 2013-06-19 DIAGNOSIS — Y939 Activity, unspecified: Secondary | ICD-10-CM | POA: Insufficient documentation

## 2013-06-19 DIAGNOSIS — S90129A Contusion of unspecified lesser toe(s) without damage to nail, initial encounter: Secondary | ICD-10-CM | POA: Insufficient documentation

## 2013-06-19 DIAGNOSIS — T148XXA Other injury of unspecified body region, initial encounter: Secondary | ICD-10-CM

## 2013-06-19 DIAGNOSIS — Z862 Personal history of diseases of the blood and blood-forming organs and certain disorders involving the immune mechanism: Secondary | ICD-10-CM | POA: Insufficient documentation

## 2013-06-19 DIAGNOSIS — S8000XA Contusion of unspecified knee, initial encounter: Secondary | ICD-10-CM | POA: Insufficient documentation

## 2013-06-19 DIAGNOSIS — S79919A Unspecified injury of unspecified hip, initial encounter: Secondary | ICD-10-CM | POA: Insufficient documentation

## 2013-06-19 DIAGNOSIS — Z79899 Other long term (current) drug therapy: Secondary | ICD-10-CM | POA: Insufficient documentation

## 2013-06-19 DIAGNOSIS — S79929A Unspecified injury of unspecified thigh, initial encounter: Secondary | ICD-10-CM

## 2013-06-19 DIAGNOSIS — Z8739 Personal history of other diseases of the musculoskeletal system and connective tissue: Secondary | ICD-10-CM | POA: Insufficient documentation

## 2013-06-19 DIAGNOSIS — Z8742 Personal history of other diseases of the female genital tract: Secondary | ICD-10-CM | POA: Insufficient documentation

## 2013-06-19 MED ORDER — HYDROCODONE-ACETAMINOPHEN 5-325 MG PO TABS
0.5000 | ORAL_TABLET | Freq: Once | ORAL | Status: AC
  Administered 2013-06-19: 0.5 via ORAL
  Filled 2013-06-19: qty 1

## 2013-06-19 MED ORDER — OXYMETAZOLINE HCL 0.05 % NA SOLN
1.0000 | Freq: Two times a day (BID) | NASAL | Status: DC
  Administered 2013-06-19: 1 via NASAL
  Filled 2013-06-19: qty 15

## 2013-06-19 MED ORDER — ONDANSETRON 4 MG PO TBDP
4.0000 mg | ORAL_TABLET | Freq: Once | ORAL | Status: AC
  Administered 2013-06-19: 4 mg via ORAL
  Filled 2013-06-19: qty 1

## 2013-06-19 MED ORDER — ONDANSETRON HCL 4 MG PO TABS: 4.0000 mg | ORAL_TABLET | Freq: Four times a day (QID) | ORAL | Status: DC

## 2013-06-19 MED ORDER — HYDROCODONE-ACETAMINOPHEN 5-325 MG PO TABS: 1.0000 | ORAL_TABLET | Freq: Four times a day (QID) | ORAL | Status: DC | PRN

## 2013-06-19 NOTE — ED Provider Notes (Addendum)
CSN: [id number]     Arrival date & time 06/19/13  1011 History   First MD Initiated Contact with Patient 06/19/13 1059     Chief Complaint  Patient presents with  . Fall     (Consider location/radiation/quality/duration/timing/severity/associated sxs/prior Treatment) Patient is a 80 y.o. female presenting with fall. The history is provided by the patient.  Fall This is a new (fell off the 3 stairs on the porch yesterday when she missed the step.  She fell directly on her face) problem. The current episode started yesterday. The problem has not changed since onset.Pertinent negatives include no headaches. Associated symptoms comments: persisent pain in the right face, intermittent nosebleed and pain in the right clavicle and left hip. Nothing aggravates the symptoms. Nothing relieves the symptoms. She has tried a cold compress for the symptoms. The treatment provided no relief.    Past Medical History  Diagnosis Date  . Chest pain   . Osteopenia   . Hyperlipidemia   . Menopause   . FH: colonic polyps   . Hypertension    Past Surgical History  Procedure Laterality Date  . Cholecystectomy    . Abdominal hysterectomy     Family History  Problem Relation Age of Onset  . Cancer Father     lung  . Cancer Sister     breast  . Diabetes Brother   . Cancer Daughter     thyroid   History  Substance Use Topics  . Smoking status: Never Smoker   . Smokeless tobacco: Not on file  . Alcohol Use: Yes   OB History   Grav Para Term Preterm Abortions TAB SAB Ect Mult Living                 Review of Systems  Musculoskeletal: Negative for neck pain and neck stiffness.  Neurological: Negative for weakness, numbness and headaches.  All other systems reviewed and are negative.      Allergies  Atorvastatin and Ezetimibe-simvastatin  Home Medications   Current Outpatient Rx  Name  Route  Sig  Dispense  Refill  . Aspirin (ANACIN PO)   Oral   Take 1 tablet by mouth daily. 81  mg         . Calcium Carbonate-Vitamin D (CALTRATE 600+D PO)   Oral   Take 1 each by mouth daily.           . Cyanocobalamin (VITAMIN B-12 CR PO)   Oral   Take 1 each by mouth daily.           Marland Kitchen ezetimibe (ZETIA) 10 MG tablet   Oral   Take 1 tablet (10 mg total) by mouth daily.   30 tablet   6   . GLUCOSAMINE PO   Oral   Take 30 mLs by mouth daily.         Marland Kitchen ibuprofen (ADVIL,MOTRIN) 600 MG tablet   Oral   Take 1 tablet (600 mg total) by mouth every 8 (eight) hours as needed for pain.   45 tablet   0   . losartan (COZAAR) 100 MG tablet      TAKE ONE TABLET BY MOUTH ONE TIME DAILY    30 tablet   2   . meclizine (ANTIVERT) 12.5 MG tablet   Oral   Take 1 tablet (12.5 mg total) by mouth 3 (three) times daily as needed for dizziness.   60 tablet   1   . Pyridoxine HCl (VITAMIN B6 PO)  Oral   Take 1 each by mouth daily.            BP 145/67  Pulse 76  Temp(Src) 97.9 F (36.6 C) (Oral)  Resp 24  Ht 5\' 2"  (1.575 m)  Wt 135 lb (61.236 kg)  BMI 24.69 kg/m2  SpO2 100% Physical Exam  Nursing note and vitals reviewed. Constitutional: She is oriented to person, place, and time. She appears well-developed and well-nourished. No distress.  HENT:  Head: Normocephalic. Head is with contusion.    Nose: Sinus tenderness present. No nasal septal hematoma. No epistaxis.  Mouth/Throat: Oropharynx is clear and moist.  Eyes: Conjunctivae and EOM are normal. Pupils are equal, round, and reactive to light.  Neck: Normal range of motion. Neck supple.  Cardiovascular: Normal rate, regular rhythm and intact distal pulses.   No murmur heard. Pulmonary/Chest: Effort normal and breath sounds normal. No respiratory distress. She has no wheezes. She has no rales. She exhibits no tenderness.  Abdominal: Soft. She exhibits no distension. There is no tenderness. There is no rebound and no guarding.  Musculoskeletal: Normal range of motion. She exhibits no edema.       Left  hip: She exhibits bony tenderness. She exhibits normal range of motion and normal strength.       Right knee: She exhibits ecchymosis. She exhibits normal range of motion and no swelling. No tenderness found.       Left knee: She exhibits ecchymosis. She exhibits normal range of motion and no swelling. No tenderness found.       Legs:      Left foot: She exhibits tenderness. She exhibits normal range of motion and no bony tenderness.       Feet:  Ecchymosis of the left great toe  Neurological: She is alert and oriented to person, place, and time.  Skin: Skin is warm and dry. No rash noted. No erythema.  Psychiatric: She has a normal mood and affect. Her behavior is normal.    ED Course  Procedures (including critical care time) Labs Review Labs Reviewed - No data to display Imaging Review Dg Clavicle Right  06/19/2013   CLINICAL DATA:  FALL  EXAM: RIGHT CLAVICLE - 2+ VIEWS  COMPARISON:  None.  FINDINGS: There is no evidence of fracture or other focal bone lesions. Soft tissues are unremarkable.  IMPRESSION: Negative.   Electronically Signed   By: Margaree Mackintosh M.D.   On: 06/19/2013 11:46   Dg Hip Complete Left  06/19/2013   CLINICAL DATA:  Left lateral hip pain after falling yesterday.  EXAM: LEFT HIP - COMPLETE 2+ VIEW  COMPARISON:  None.  FINDINGS: The bones appear adequately mineralized. There is no evidence of acute fracture, dislocation or femoral head avascular necrosis. Mild degenerative changes at both hips are noted. There is a sclerotic lesion in the intertrochanteric region of the left femur, likely a bone island. Mild degenerative changes are present at the symphysis pubis.  IMPRESSION: No evidence of acute hip fracture or dislocation. If the patient has persistent hip pain or inability to bear weight, follow up imaging may be warranted as hip fractures can be radiographically occult in the elderly.   Electronically Signed   By: Camie Patience M.D.   On: 06/19/2013 11:52   Ct  Maxillofacial Wo Cm  06/19/2013   CLINICAL DATA:  Left facial abrasions status post fall last night.  EXAM: CT MAXILLOFACIAL WITHOUT CONTRAST  TECHNIQUE: Multidetector CT imaging of the maxillofacial structures was performed.  Multiplanar CT image reconstructions were also generated. A small metallic BB was placed on the right temple in order to reliably differentiate right from left.  COMPARISON:  Head CT 07/27/2004.  FINDINGS: There are nondisplaced fractures involving the nasal bones and nasal process of the maxilla with adjacent soft tissue swelling. No other facial fractures are demonstrated. The orbits are intact. There is some asymmetric soft tissue swelling over the right malar region.  The paranasal sinuses, mastoid air cells and middle ears are clear without air-fluid levels. There is no evidence of orbital hematoma.  Degenerative changes are present at both temporomandibular joints. Intracranial vascular calcifications are noted.  IMPRESSION: 1. Nondisplaced nasal bone and nasal process of the maxilla fractures. 2. Facial soft tissue swelling asymmetric to the right. No evidence of orbital hematoma.   Electronically Signed   By: Camie Patience M.D.   On: 06/19/2013 11:50     EKG Interpretation None      MDM   Final diagnoses:  Fall  Contusion  Nasal bone fracture    Patient is here for a mechanical fall 24 hours ago on her steps. She's had persistent right-sided facial pain and intermittent nosebleed as well as a right clavicle pain and left hip pain. She's been able to ambulate without difficulty but states that the pain kept her up all night. On exam patient has multiple abrasions and ecchymosis over the pins, left great toe, right shoulder, right clavicle. Significant ecchymosis and abrasions over the right side of the face with tenderness with palpation of the nose and clotted blood in the right nare are without septal hematoma.  Extraocular movements are intact without signs of  entrapment and no neck pain or tenderness with range of motion. Patient denies hitting her head or LOC and takes no anticoagulation. She has no numbness or tingling and is able to walk without difficulty. She does have significant point tenderness over the right clavicle and left lateral hip.  Plain films of the clavicle and hip are within normal limits. Facial  CT shows a nondisplaced nasal bone fracture without other fractures present.  Discussed findings with pt and given oral pain meds and afrin.    Blanchie Dessert, MD 06/19/13 8099  Blanchie Dessert, MD 06/19/13 1213

## 2013-06-19 NOTE — ED Notes (Signed)
Patient tripped and fell down outside stairs, c/o face pain & r shoulder & L big toe pain

## 2013-06-21 ENCOUNTER — Telehealth: Payer: Self-pay | Admitting: Family Medicine

## 2013-06-21 NOTE — Telephone Encounter (Signed)
06/21/13  Pt went to the Urgent Care on 06/19/13 and was given an rx HYDROcodone-acetaminophen (NORCO/VICODIN and ondansetron (ZOFRAN) 4 MG ; pt wants to know if these meds will interact with her current BP medications.  Please contact pt to advise.

## 2013-06-21 NOTE — Telephone Encounter (Signed)
No- she can take these meds safely

## 2013-06-21 NOTE — Telephone Encounter (Signed)
Pt.notified

## 2013-06-23 ENCOUNTER — Encounter: Payer: Self-pay | Admitting: Family Medicine

## 2013-06-23 ENCOUNTER — Ambulatory Visit (INDEPENDENT_AMBULATORY_CARE_PROVIDER_SITE_OTHER): Payer: Medicare Other | Admitting: Family Medicine

## 2013-06-23 VITALS — BP 124/78 | HR 74 | Temp 98.2°F | Resp 16 | Wt 136.1 lb

## 2013-06-23 DIAGNOSIS — S022XXA Fracture of nasal bones, initial encounter for closed fracture: Secondary | ICD-10-CM

## 2013-06-23 DIAGNOSIS — Y92009 Unspecified place in unspecified non-institutional (private) residence as the place of occurrence of the external cause: Secondary | ICD-10-CM | POA: Insufficient documentation

## 2013-06-23 DIAGNOSIS — S0181XA Laceration without foreign body of other part of head, initial encounter: Secondary | ICD-10-CM | POA: Insufficient documentation

## 2013-06-23 DIAGNOSIS — S0180XA Unspecified open wound of other part of head, initial encounter: Secondary | ICD-10-CM

## 2013-06-23 DIAGNOSIS — W19XXXA Unspecified fall, initial encounter: Secondary | ICD-10-CM

## 2013-06-23 MED ORDER — OXYCODONE-ACETAMINOPHEN 5-325 MG PO TABS: 1.0000 | ORAL_TABLET | Freq: Three times a day (TID) | ORAL | Status: DC | PRN

## 2013-06-23 NOTE — Assessment & Plan Note (Signed)
New.  Pt's nose appears to be in good position, breathing w/o difficulty.  Pt has no plans to see ENT.  Reviewed red flags.  Will follow.

## 2013-06-23 NOTE — Progress Notes (Signed)
   Subjective:    Patient ID: Alicia Joseph, female    DOB: 11-14-1929, 80 y.o.   MRN: [id number]  HPI Fall- pt fell on 4/3 and broke her nose.  Was walking down the steps when she missed a step and fell face first.  Having severe pain in face and R shoulder and unable to sleep.  Hydrocodone is not helping pain.  Pt's biggest concern is that she will have scars from the scabs on face.  Pt has no intention of seeing ENT- breathing w/o difficulty.   Review of Systems For ROS see HPI     Objective:   Physical Exam  Vitals reviewed. Constitutional: She appears well-developed and well-nourished. No distress.  HENT:  Nose is clear, relatively straight  Musculoskeletal: Normal range of motion. She exhibits no edema.  Normal ROM of shoulders and hips bilaterally  Skin: Skin is warm and dry.  Diffuse ecchymosis of R side of face w/ scabbing superficial abrasions (road rash)          Assessment & Plan:

## 2013-06-23 NOTE — Progress Notes (Signed)
Pre visit review using our clinic review tool, if applicable. No additional management support is needed unless otherwise documented below in the visit note. 

## 2013-06-23 NOTE — Assessment & Plan Note (Signed)
New.  Scabs are present and no evidence of superimposed infection.  Reviewed wound care and scar treatment.

## 2013-06-23 NOTE — Assessment & Plan Note (Signed)
New.  Pt reports she feels steady on her feet despite falling x3 this year.  Reviewed importance of taking her time, changing positions slowly, eliminating throw rugs and furniture that could cause falls.  Will follow.

## 2013-06-23 NOTE — Patient Instructions (Signed)
Follow up as needed Start the Oxycodone nightly for pain, use the hydrocodone for daytime pain- do not drive on either medication Apply Mederma as directed on the package or Vit E cream twice daily gently to scabs to keep them well moisturized and minimize scarring Call with any questions or concerns Hang in there!!

## 2013-07-05 ENCOUNTER — Other Ambulatory Visit: Payer: Self-pay | Admitting: Family Medicine

## 2013-07-05 NOTE — Telephone Encounter (Signed)
Rx sent to the pharmacy by e-script.//AB/CMA 

## 2013-08-06 ENCOUNTER — Other Ambulatory Visit: Payer: Self-pay | Admitting: Family Medicine

## 2013-08-06 NOTE — Telephone Encounter (Signed)
Med filled.  

## 2013-09-09 ENCOUNTER — Other Ambulatory Visit: Payer: Self-pay | Admitting: Family Medicine

## 2013-09-09 NOTE — Telephone Encounter (Signed)
Med filled.  

## 2013-10-15 ENCOUNTER — Telehealth: Payer: Self-pay | Admitting: Family Medicine

## 2013-10-15 MED ORDER — LOSARTAN POTASSIUM 100 MG PO TABS: ORAL_TABLET | ORAL | Status: DC

## 2013-10-15 NOTE — Telephone Encounter (Signed)
Med filled and pt notified.  

## 2013-10-15 NOTE — Telephone Encounter (Signed)
Caller name: [facility]  Relation to pt: Call back [place], [place]   Reason for call: pt would 90 day re-fill on losartan (COZAAR) 100 MG tablet

## 2013-12-27 ENCOUNTER — Encounter: Payer: Self-pay | Admitting: Gastroenterology

## 2014-01-08 ENCOUNTER — Other Ambulatory Visit: Payer: Self-pay | Admitting: Family Medicine

## 2014-01-08 NOTE — Telephone Encounter (Signed)
Med filled.  

## 2014-02-16 ENCOUNTER — Encounter: Payer: Self-pay | Admitting: Family Medicine

## 2014-02-16 ENCOUNTER — Other Ambulatory Visit: Payer: Self-pay | Admitting: General Practice

## 2014-02-16 ENCOUNTER — Ambulatory Visit (INDEPENDENT_AMBULATORY_CARE_PROVIDER_SITE_OTHER): Payer: Medicare Other | Admitting: Family Medicine

## 2014-02-16 VITALS — BP 140/70 | HR 72 | Temp 97.9°F | Resp 16 | Wt 138.5 lb

## 2014-02-16 DIAGNOSIS — I10 Essential (primary) hypertension: Secondary | ICD-10-CM

## 2014-02-16 DIAGNOSIS — E785 Hyperlipidemia, unspecified: Secondary | ICD-10-CM

## 2014-02-16 DIAGNOSIS — F411 Generalized anxiety disorder: Secondary | ICD-10-CM

## 2014-02-16 LAB — LIPID PANEL
CHOLESTEROL: 236 mg/dL — AB (ref 0–200)
HDL: 69.9 mg/dL (ref >39.00)
LDL CALC: 150 mg/dL — AB (ref 0–99)
NonHDL: 166.1
TRIGLYCERIDES: 80 mg/dL (ref 0.0–149.0)
Total CHOL/HDL Ratio: 3
VLDL: 16 mg/dL (ref 0.0–40.0)

## 2014-02-16 LAB — CBC WITH DIFFERENTIAL/PLATELET
BASOS ABS: 0 10*3/uL (ref 0.0–0.1)
Basophils Relative: 0.6 % (ref 0.0–3.0)
EOS ABS: 0.2 10*3/uL (ref 0.0–0.7)
Eosinophils Relative: 2.1 % (ref 0.0–5.0)
HCT: 40.5 % (ref 36.0–46.0)
Hemoglobin: 13.3 g/dL (ref 12.0–15.0)
Lymphocytes Relative: 22.1 % (ref 12.0–46.0)
Lymphs Abs: 1.7 10*3/uL (ref 0.7–4.0)
MCHC: 32.9 g/dL (ref 30.0–36.0)
MCV: 95.6 fl (ref 78.0–100.0)
Monocytes Absolute: 0.6 10*3/uL (ref 0.1–1.0)
Monocytes Relative: 7.9 % (ref 3.0–12.0)
NEUTROS PCT: 67.3 % (ref 43.0–77.0)
Neutro Abs: 5.2 10*3/uL (ref 1.4–7.7)
Platelets: 272 10*3/uL (ref 150.0–400.0)
RBC: 4.24 Mil/uL (ref 3.87–5.11)
RDW: 13.9 % (ref 11.5–15.5)
WBC: 7.8 10*3/uL (ref 4.0–10.5)

## 2014-02-16 LAB — HEPATIC FUNCTION PANEL
ALK PHOS: 67 U/L (ref 39–117)
ALT: 13 U/L (ref 0–35)
AST: 18 U/L (ref 0–37)
Albumin: 4 g/dL (ref 3.5–5.2)
BILIRUBIN DIRECT: 0 mg/dL (ref 0.0–0.3)
BILIRUBIN TOTAL: 0.5 mg/dL (ref 0.2–1.2)
TOTAL PROTEIN: 7.7 g/dL (ref 6.0–8.3)

## 2014-02-16 LAB — BASIC METABOLIC PANEL
BUN: 12 mg/dL (ref 6–23)
CALCIUM: 9.2 mg/dL (ref 8.4–10.5)
CO2: 29 mEq/L (ref 19–32)
CREATININE: 0.6 mg/dL (ref 0.4–1.2)
Chloride: 98 mEq/L (ref 96–112)
GFR: 110.17 mL/min (ref >60.00)
Glucose, Bld: 103 mg/dL — ABNORMAL HIGH (ref 70–99)
Potassium: 4.5 mEq/L (ref 3.5–5.1)
Sodium: 131 mEq/L — ABNORMAL LOW (ref 135–145)

## 2014-02-16 MED ORDER — EZETIMIBE 10 MG PO TABS: 10.0000 mg | ORAL_TABLET | Freq: Every day | ORAL | Status: DC

## 2014-02-16 MED ORDER — SERTRALINE HCL 25 MG PO TABS: 25.0000 mg | ORAL_TABLET | Freq: Every day | ORAL | Status: DC

## 2014-02-16 NOTE — Patient Instructions (Addendum)
Follow up in 1 month to recheck mood We'll notify you of your lab results and make any changes if needed Start the Sertraline daily for depression/anxiety Try and take time for yourself- you've earned it! Call with any questions or concerns Happy Holidays and Happy Early Rudene Anda!

## 2014-02-16 NOTE — Progress Notes (Signed)
Pre visit review using our clinic review tool, if applicable. No additional management support is needed unless otherwise documented below in the visit note. 

## 2014-02-16 NOTE — Assessment & Plan Note (Signed)
Chronic problem.  Pt has hx of being statin intolerant but doing fine on Zetia (although cost is high).  Check labs.  Adjust meds prn.

## 2014-02-16 NOTE — Assessment & Plan Note (Signed)
Deteriorated.  Pt now tearful in office.  Discussed that she needs to take time for herself and find an outlet from her responsibilities.  Has plans to visit daughter over holidays.  Pt to start Zoloft daily to improve mood.

## 2014-02-16 NOTE — Progress Notes (Signed)
   Subjective:    Patient ID: Alicia Joseph, female    DOB: 03/25/1929, 80 y.o.   MRN: [id number]  HPI HTN= chronic problem.  Adequate control on Losartan.  Denies CP, SOB, HAs, visual changes.  Hyperlipidemia- chronic problem, on Zetia.  Denies abd pain, N/V, myalgias.  Caregiver stress- pt is overwhelmed w/ burden of financial support of daughter and 2 children.  Husband is now ill and is physical caregiver for him.  Pt is tearful, clearly upset.  Pt has plans to visit daughter in [place].   Review of Systems For ROS see HPI     Objective:   Physical Exam  Constitutional: She is oriented to person, place, and time. She appears well-developed and well-nourished. No distress.  HENT:  Head: Normocephalic and atraumatic.  Eyes: Conjunctivae and EOM are normal. Pupils are equal, round, and reactive to light.  Neck: Normal range of motion. Neck supple. No thyromegaly present.  Cardiovascular: Normal rate, regular rhythm, normal heart sounds and intact distal pulses.   No murmur heard. Pulmonary/Chest: Effort normal and breath sounds normal. No respiratory distress.  Abdominal: Soft. She exhibits no distension. There is no tenderness.  Musculoskeletal: She exhibits no edema.  Lymphadenopathy:    She has no cervical adenopathy.  Neurological: She is alert and oriented to person, place, and time.  Skin: Skin is warm and dry.  Psychiatric: She has a normal mood and affect. Her behavior is normal.  Vitals reviewed.         Assessment & Plan:

## 2014-02-16 NOTE — Assessment & Plan Note (Signed)
Chronic problem.  Adequate control.  Asymptomatic.  Check labs.  No anticipated med changes 

## 2014-02-17 ENCOUNTER — Encounter: Payer: Self-pay | Admitting: General Practice

## 2014-04-11 ENCOUNTER — Other Ambulatory Visit: Payer: Self-pay | Admitting: Family Medicine

## 2014-04-11 NOTE — Telephone Encounter (Signed)
Med filled.  

## 2014-07-11 ENCOUNTER — Encounter: Payer: Self-pay | Admitting: General Practice

## 2014-07-11 ENCOUNTER — Ambulatory Visit (INDEPENDENT_AMBULATORY_CARE_PROVIDER_SITE_OTHER): Payer: Medicare Other | Admitting: Family Medicine

## 2014-07-11 ENCOUNTER — Encounter: Payer: Self-pay | Admitting: Family Medicine

## 2014-07-11 VITALS — BP 140/80 | HR 68 | Temp 97.7°F | Resp 16 | Ht 62.5 in | Wt 134.8 lb

## 2014-07-11 DIAGNOSIS — E785 Hyperlipidemia, unspecified: Secondary | ICD-10-CM | POA: Diagnosis not present

## 2014-07-11 DIAGNOSIS — Z0000 Encounter for general adult medical examination without abnormal findings: Secondary | ICD-10-CM

## 2014-07-11 DIAGNOSIS — Z23 Encounter for immunization: Secondary | ICD-10-CM

## 2014-07-11 DIAGNOSIS — M8580 Other specified disorders of bone density and structure, unspecified site: Secondary | ICD-10-CM

## 2014-07-11 DIAGNOSIS — F411 Generalized anxiety disorder: Secondary | ICD-10-CM

## 2014-07-11 DIAGNOSIS — I10 Essential (primary) hypertension: Secondary | ICD-10-CM

## 2014-07-11 LAB — HEPATIC FUNCTION PANEL
ALK PHOS: 68 U/L (ref 39–117)
ALT: 10 U/L (ref 0–35)
AST: 18 U/L (ref 0–37)
Albumin: 4 g/dL (ref 3.5–5.2)
BILIRUBIN TOTAL: 0.6 mg/dL (ref 0.2–1.2)
Bilirubin, Direct: 0.1 mg/dL (ref 0.0–0.3)
Total Protein: 7.7 g/dL (ref 6.0–8.3)

## 2014-07-11 LAB — BASIC METABOLIC PANEL
BUN: 11 mg/dL (ref 6–23)
CO2: 27 mEq/L (ref 19–32)
CREATININE: 0.54 mg/dL (ref 0.40–1.20)
Calcium: 9.3 mg/dL (ref 8.4–10.5)
Chloride: 100 mEq/L (ref 96–112)
GFR: 114.78 mL/min (ref >60.00)
Glucose, Bld: 97 mg/dL (ref 70–99)
Potassium: 4.2 mEq/L (ref 3.5–5.1)
Sodium: 131 mEq/L — ABNORMAL LOW (ref 135–145)

## 2014-07-11 LAB — LIPID PANEL
CHOLESTEROL: 224 mg/dL — AB (ref 0–200)
HDL: 76 mg/dL (ref >39.00)
LDL Cholesterol: 136 mg/dL — ABNORMAL HIGH (ref 0–99)
NonHDL: 148
Total CHOL/HDL Ratio: 3
Triglycerides: 62 mg/dL (ref 0.0–149.0)
VLDL: 12.4 mg/dL (ref 0.0–40.0)

## 2014-07-11 LAB — CBC WITH DIFFERENTIAL/PLATELET
Basophils Absolute: 0 10*3/uL (ref 0.0–0.1)
Basophils Relative: 0.5 % (ref 0.0–3.0)
EOS ABS: 0.2 10*3/uL (ref 0.0–0.7)
Eosinophils Relative: 2 % (ref 0.0–5.0)
HCT: 39.9 % (ref 36.0–46.0)
HEMOGLOBIN: 13.5 g/dL (ref 12.0–15.0)
LYMPHS ABS: 1.3 10*3/uL (ref 0.7–4.0)
Lymphocytes Relative: 16.9 % (ref 12.0–46.0)
MCHC: 33.8 g/dL (ref 30.0–36.0)
MCV: 93.9 fl (ref 78.0–100.0)
MONO ABS: 0.6 10*3/uL (ref 0.1–1.0)
MONOS PCT: 7.6 % (ref 3.0–12.0)
NEUTROS PCT: 73 % (ref 43.0–77.0)
Neutro Abs: 5.7 10*3/uL (ref 1.4–7.7)
PLATELETS: 283 10*3/uL (ref 150.0–400.0)
RBC: 4.25 Mil/uL (ref 3.87–5.11)
RDW: 13.5 % (ref 11.5–15.5)
WBC: 7.9 10*3/uL (ref 4.0–10.5)

## 2014-07-11 LAB — VITAMIN D 25 HYDROXY (VIT D DEFICIENCY, FRACTURES): VITD: 36.24 ng/mL (ref 30.00–100.00)

## 2014-07-11 LAB — TSH: TSH: 1.69 u[IU]/mL (ref 0.35–4.50)

## 2014-07-11 NOTE — Progress Notes (Signed)
   Subjective:    Patient ID: Alicia Joseph, female    DOB: [birth year], 81 y.o.   MRN: [id number]  HPI Here today for CPE.  Risk Factors: HTN- chronic problem, on Losartan, BP is elevated.  No longer checking home BP.  Denies CP, SOB, HAs, visual changes, edema. Hyperlipidemia- chronic problem, statin intolerant.  Denies abd pain, N/V, myalgias Physical Activity: very active w/ housework and gardening Fall Risk: low risk Depression: chronic problem, pt never filled her Zoloft.  Denies current depressive sxs, 'i'm handling things a lot better'. Hearing: normal to conversational tones, mildly decreased to whispered voice or when speaking on the phone ADL's: independent Cognitive: normal linear thought process, memory and attention intact Home Safety: safe at home, lives w/ husband (is his primary caregiver) Height, Weight, BMI, Visual Acuity: see vitals, vision corrected to 20/20 w/ glasses Counseling: UTD on colonoscopy, DEXA.  Due for mammo- pt plans to schedule.  Due for Prevnar Labs Ordered: See A&P Care Plan: See A&P    Review of Systems Patient reports no vision/ hearing changes, adenopathy,fever, weight change,  persistant/recurrent hoarseness , swallowing issues, chest pain, palpitations, edema, persistant/recurrent cough, hemoptysis, dyspnea (rest/exertional/paroxysmal nocturnal), gastrointestinal bleeding (melena, rectal bleeding), abdominal pain, significant heartburn, bowel changes, GU symptoms (dysuria, hematuria, incontinence), Gyn symptoms (abnormal  bleeding, pain),  syncope, focal weakness, memory loss, numbness & tingling, skin/hair/nail changes, abnormal bruising or bleeding, anxiety, or depression.     Objective:   Physical Exam General Appearance:    Alert, cooperative, no distress, appears stated age  Head:    Normocephalic, without obvious abnormality, atraumatic  Eyes:    PERRL, conjunctiva/corneas clear, EOM's intact, fundi    benign, both eyes  Ears:    Normal  TM's and external ear canals, both ears  Nose:   Nares normal, septum midline, mucosa normal, no drainage    or sinus tenderness  Throat:   Lips, mucosa, and tongue normal; teeth and gums normal  Neck:   Supple, symmetrical, trachea midline, no adenopathy;    Thyroid: no enlargement/tenderness/nodules  Back:     Symmetric, no curvature, ROM normal, no CVA tenderness  Lungs:     Clear to auscultation bilaterally, respirations unlabored  Chest Wall:    No tenderness or deformity   Heart:    Regular rate and rhythm, S1 and S2 normal, no murmur, rub   or gallop  Breast Exam:    Deferred to mammo  Abdomen:     Soft, non-tender, bowel sounds active all four quadrants,    no masses, no organomegaly  Genitalia:    Deferred  Rectal:    Extremities:   Extremities normal, atraumatic, no cyanosis or edema  Pulses:   2+ and symmetric all extremities  Skin:   Skin color, texture, turgor normal, no rashes or lesions  Lymph nodes:   Cervical, supraclavicular, and axillary nodes normal  Neurologic:   CNII-XII intact, normal strength, sensation and reflexes    throughout          Assessment & Plan:

## 2014-07-11 NOTE — Patient Instructions (Signed)
Follow up in 6 months to recheck your BP and cholesterol We'll notify you of your lab results and make any changes if needed Keep up the good work- you look great! Please call and schedule your mammogram Call with any questions or concerns Happy Spring!!!

## 2014-07-11 NOTE — Progress Notes (Signed)
Pre visit review using our clinic review tool, if applicable. No additional management support is needed unless otherwise documented below in the visit note. 

## 2014-07-12 NOTE — Assessment & Plan Note (Signed)
Pt's PE WNL.  UTD on colonoscopy and DEXA- due for mammo.  Pt plans to schedule.  Written screening schedule updated and given to pt..  prevnar given today.  Check labs.  Anticipatory guidance provided.

## 2014-07-12 NOTE — Assessment & Plan Note (Signed)
Chronic problem.  Adequate control.  Asymptomatic.  Check labs.  No anticipated med changes 

## 2014-07-12 NOTE — Assessment & Plan Note (Signed)
Chronic problem.  Pt reports this has improved and that she is handling things better.  Continues to work on stress outlets- prefers gardening this time of year.  Not interested in meds at this time.  Will continue to follow.

## 2014-07-12 NOTE — Assessment & Plan Note (Signed)
Chronic problem.  Tolerating zetia w/o difficulty.  Hx of statin intolerance.  Check labs.  Adjust meds prn

## 2014-07-12 NOTE — Assessment & Plan Note (Signed)
Chronic problem.  UTD on DEXA.  Check Vit D.  Replete prn. 

## 2014-07-14 ENCOUNTER — Other Ambulatory Visit: Payer: Self-pay | Admitting: Family Medicine

## 2014-07-14 ENCOUNTER — Other Ambulatory Visit: Payer: Self-pay | Admitting: Internal Medicine

## 2014-07-14 DIAGNOSIS — Z1231 Encounter for screening mammogram for malignant neoplasm of breast: Secondary | ICD-10-CM

## 2014-07-14 NOTE — Telephone Encounter (Signed)
Med filled.  

## 2014-08-01 ENCOUNTER — Ambulatory Visit (HOSPITAL_BASED_OUTPATIENT_CLINIC_OR_DEPARTMENT_OTHER)
Admission: RE | Admit: 2014-08-01 | Discharge: 2014-08-01 | Disposition: A | Discharge status: Home / Self Care | Payer: Medicare Other | Source: Ambulatory Visit | Attending: Internal Medicine | Admitting: Internal Medicine

## 2014-08-01 DIAGNOSIS — Z1231 Encounter for screening mammogram for malignant neoplasm of breast: Secondary | ICD-10-CM | POA: Diagnosis not present

## 2014-08-05 DIAGNOSIS — H524 Presbyopia: Secondary | ICD-10-CM | POA: Diagnosis not present

## 2014-08-05 DIAGNOSIS — Z83511 Family history of glaucoma: Secondary | ICD-10-CM | POA: Diagnosis not present

## 2014-08-05 DIAGNOSIS — D3132 Benign neoplasm of left choroid: Secondary | ICD-10-CM | POA: Diagnosis not present

## 2014-08-05 DIAGNOSIS — H2513 Age-related nuclear cataract, bilateral: Secondary | ICD-10-CM | POA: Diagnosis not present

## 2014-09-15 DIAGNOSIS — H2513 Age-related nuclear cataract, bilateral: Secondary | ICD-10-CM | POA: Diagnosis not present

## 2014-09-26 DIAGNOSIS — H2589 Other age-related cataract: Secondary | ICD-10-CM | POA: Diagnosis not present

## 2014-09-26 DIAGNOSIS — H2511 Age-related nuclear cataract, right eye: Secondary | ICD-10-CM | POA: Diagnosis not present

## 2014-09-26 DIAGNOSIS — H2513 Age-related nuclear cataract, bilateral: Secondary | ICD-10-CM | POA: Diagnosis not present

## 2014-10-21 ENCOUNTER — Emergency Department (HOSPITAL_BASED_OUTPATIENT_CLINIC_OR_DEPARTMENT_OTHER): Payer: Medicare Other

## 2014-10-21 ENCOUNTER — Encounter (HOSPITAL_BASED_OUTPATIENT_CLINIC_OR_DEPARTMENT_OTHER): Payer: Self-pay | Admitting: *Deleted

## 2014-10-21 ENCOUNTER — Emergency Department (HOSPITAL_BASED_OUTPATIENT_CLINIC_OR_DEPARTMENT_OTHER)
Admission: EM | Admit: 2014-10-21 | Discharge: 2014-10-21 | Disposition: A | Discharge status: Home / Self Care | Payer: Medicare Other | Attending: Emergency Medicine | Admitting: Emergency Medicine

## 2014-10-21 DIAGNOSIS — Z7982 Long term (current) use of aspirin: Secondary | ICD-10-CM | POA: Insufficient documentation

## 2014-10-21 DIAGNOSIS — E785 Hyperlipidemia, unspecified: Secondary | ICD-10-CM | POA: Diagnosis not present

## 2014-10-21 DIAGNOSIS — Z79899 Other long term (current) drug therapy: Secondary | ICD-10-CM | POA: Diagnosis not present

## 2014-10-21 DIAGNOSIS — Y93F2 Activity, caregiving, lifting: Secondary | ICD-10-CM | POA: Diagnosis not present

## 2014-10-21 DIAGNOSIS — Z86010 Personal history of colonic polyps: Secondary | ICD-10-CM | POA: Insufficient documentation

## 2014-10-21 DIAGNOSIS — Y92007 Garden or yard of unspecified non-institutional (private) residence as the place of occurrence of the external cause: Secondary | ICD-10-CM | POA: Insufficient documentation

## 2014-10-21 DIAGNOSIS — Y998 Other external cause status: Secondary | ICD-10-CM | POA: Insufficient documentation

## 2014-10-21 DIAGNOSIS — M25532 Pain in left wrist: Secondary | ICD-10-CM | POA: Diagnosis not present

## 2014-10-21 DIAGNOSIS — Z8739 Personal history of other diseases of the musculoskeletal system and connective tissue: Secondary | ICD-10-CM | POA: Diagnosis not present

## 2014-10-21 DIAGNOSIS — Z780 Asymptomatic menopausal state: Secondary | ICD-10-CM | POA: Diagnosis not present

## 2014-10-21 DIAGNOSIS — I10 Essential (primary) hypertension: Secondary | ICD-10-CM | POA: Diagnosis not present

## 2014-10-21 DIAGNOSIS — X58XXXA Exposure to other specified factors, initial encounter: Secondary | ICD-10-CM | POA: Insufficient documentation

## 2014-10-21 DIAGNOSIS — M79645 Pain in left finger(s): Secondary | ICD-10-CM

## 2014-10-21 DIAGNOSIS — S6992XA Unspecified injury of left wrist, hand and finger(s), initial encounter: Secondary | ICD-10-CM | POA: Insufficient documentation

## 2014-10-21 DIAGNOSIS — M19032 Primary osteoarthritis, left wrist: Secondary | ICD-10-CM | POA: Diagnosis not present

## 2014-10-21 DIAGNOSIS — M19042 Primary osteoarthritis, left hand: Secondary | ICD-10-CM | POA: Diagnosis not present

## 2014-10-21 MED ORDER — IBUPROFEN 600 MG PO TABS: 600.0000 mg | ORAL_TABLET | Freq: Four times a day (QID) | ORAL | Status: DC | PRN

## 2014-10-21 MED ORDER — TRAMADOL HCL 50 MG PO TABS: 50.0000 mg | ORAL_TABLET | Freq: Four times a day (QID) | ORAL | Status: DC | PRN

## 2014-10-21 NOTE — Discharge Instructions (Signed)

## 2014-10-21 NOTE — ED Notes (Signed)
States she was doing yard work yesterday and was lifting a shovel with left hand and felt pain in left hand and wrist. Redness noted to left hand.

## 2014-10-21 NOTE — ED Provider Notes (Signed)
CSN: [id number]     Arrival date & time 10/21/14  1501 History   First MD Initiated Contact with Patient 10/21/14 1605     No chief complaint on file.    (Consider location/radiation/quality/duration/timing/severity/associated sxs/prior Treatment) Patient is a 81 y.o. female presenting with wrist pain.  Wrist Pain This is a new problem. The current episode started yesterday. The problem occurs constantly. The problem has not changed since onset.Pertinent negatives include no chest pain, no abdominal pain, no headaches and no shortness of breath. Relieved by: hurts all the time. Treatments tried: unasyn. The treatment provided no relief.    Past Medical History  Diagnosis Date  . Chest pain   . Osteopenia   . Hyperlipidemia   . Menopause   . FH: colonic polyps   . Hypertension    Past Surgical History  Procedure Laterality Date  . Cholecystectomy    . Abdominal hysterectomy     Family History  Problem Relation Age of Onset  . Cancer Father     lung  . Cancer Sister     breast  . Diabetes Brother   . Cancer Daughter     thyroid   History  Substance Use Topics  . Smoking status: Never Smoker   . Smokeless tobacco: Not on file  . Alcohol Use: Yes   OB History    No data available     Review of Systems  Constitutional: Negative for fever.  HENT: Negative for sore throat.   Eyes: Negative for visual disturbance.  Respiratory: Negative for cough and shortness of breath.   Cardiovascular: Negative for chest pain.  Gastrointestinal: Negative for abdominal pain.  Genitourinary: Negative for difficulty urinating.  Musculoskeletal: Negative for back pain and neck pain.  Skin: Negative for rash.  Neurological: Negative for syncope and headaches.      Allergies  Atorvastatin and Ezetimibe-simvastatin  Home Medications   Prior to Admission medications   Medication Sig Start Date End Date Taking? Authorizing Provider  Aspirin (ANACIN PO) Take 1 tablet by mouth  daily. 81 mg   Yes Historical Provider, MD  losartan (COZAAR) 100 MG tablet TAKE ONE TABLET BY MOUTH ONE TIME DAILY  04/11/14  Yes Midge Minium, MD  ZETIA 10 MG tablet TAKE ONE TABLET BY MOUTH ONE TIME DAILY 07/14/14  Yes Midge Minium, MD  ibuprofen (ADVIL,MOTRIN) 600 MG tablet Take 1 tablet (600 mg total) by mouth every 6 (six) hours as needed. 10/21/14   Gareth Morgan, MD  traMADol (ULTRAM) 50 MG tablet Take 1 tablet (50 mg total) by mouth every 6 (six) hours as needed. 10/21/14   Gareth Morgan, MD   BP 200/89 mmHg  Pulse 66  Resp 18  SpO2 98% Physical Exam  Constitutional: She is oriented to person, place, and time. She appears well-developed and well-nourished. No distress.  HENT:  Head: Normocephalic and atraumatic.  Eyes: Conjunctivae and EOM are normal.  Neck: Normal range of motion.  Cardiovascular: Normal rate, regular rhythm, normal heart sounds and intact distal pulses.  Exam reveals no gallop and no friction rub.   No murmur heard. Pulmonary/Chest: Effort normal and breath sounds normal. No respiratory distress. She has no wheezes. She has no rales.  Abdominal: Soft. She exhibits no distension. There is no tenderness. There is no guarding.  Musculoskeletal: She exhibits no edema.       Left hand: She exhibits normal range of motion, normal capillary refill, no deformity, no laceration and no swelling. Tenderness: snuff box  tenderness, pain with ROM of thumb. Normal sensation noted. Decreased sensation is not present in the ulnar distribution, is not present in the medial redistribution and is not present in the radial distribution. Decreased strength noted. She exhibits thumb/finger opposition (limited by pain). She exhibits no finger abduction and no wrist extension trouble.  Neurological: She is alert and oriented to person, place, and time.  Skin: Skin is warm and dry. No rash noted. She is not diaphoretic. No erythema.  Nursing note and vitals reviewed.   ED  Course  Procedures (including critical care time) Labs Review Labs Reviewed - No data to display  Imaging Review Dg Wrist Complete Left  10/21/2014   CLINICAL DATA:  Pain in left hand and wrist after shoveling in her garden  EXAM: LEFT WRIST - COMPLETE 3+ VIEW  COMPARISON:  None.  FINDINGS: Mild arthritis of the first carpometacarpal joint. No fracture or dislocation.  IMPRESSION: No acute findings   Electronically Signed   By: Skipper Cliche M.D.   On: 10/21/2014 15:51   Dg Hand Complete Left  10/21/2014   CLINICAL DATA:  Pain left hand and wrist after shoveling in her garden today  EXAM: LEFT HAND - COMPLETE 3+ VIEW  COMPARISON:  None.  FINDINGS: Moderate arthritic change in the first carpal/metacarpal joint without fracture or dislocation. Mild distal interphalangeal joint arthritis throughout the hand.  IMPRESSION: No acute findings   Electronically Signed   By: Skipper Cliche M.D.   On: 10/21/2014 15:54     EKG Interpretation None      MDM   Final diagnoses:  Thumb pain, left  Left wrist pain, rule out scaphoid fracture vs gamekeeper's thumb   81 year old female with history of hyperlipidemia, hypertension presents with left wrist pain after lifting a shovel while gardening yesterday.  XR of left hand and wrist show no acute fracture or dislocation.  By history and exam have concern for occult scaphoid fracture or gamekeeper's thumb.  Placed in thumb spica.  Patient discharged in stable condition with understanding of reasons to return.     Gareth Morgan, MD 10/22/14 2219

## 2014-10-24 ENCOUNTER — Telehealth: Payer: Self-pay | Admitting: Family Medicine

## 2014-10-24 MED ORDER — LOSARTAN POTASSIUM 100 MG PO TABS: 100.0000 mg | ORAL_TABLET | Freq: Every day | ORAL | Status: DC

## 2014-10-24 NOTE — Telephone Encounter (Signed)
Caller name:Peter Geralds Relation to JJ:HERDEY Call back number:[phone] Reason for call: pt is needing losartan (COZAAR) 100 MG tablet, pt has 1 pill left  Would like for you to call when called in

## 2014-10-24 NOTE — Telephone Encounter (Signed)
Medication filled to pharmacy as requested.  Pt notified. 

## 2014-11-29 DIAGNOSIS — H2512 Age-related nuclear cataract, left eye: Secondary | ICD-10-CM | POA: Diagnosis not present

## 2014-11-29 DIAGNOSIS — H5203 Hypermetropia, bilateral: Secondary | ICD-10-CM | POA: Diagnosis not present

## 2014-12-05 DIAGNOSIS — H259 Unspecified age-related cataract: Secondary | ICD-10-CM | POA: Diagnosis not present

## 2014-12-05 DIAGNOSIS — H2512 Age-related nuclear cataract, left eye: Secondary | ICD-10-CM | POA: Diagnosis not present

## 2014-12-05 DIAGNOSIS — H2589 Other age-related cataract: Secondary | ICD-10-CM | POA: Diagnosis not present

## 2014-12-27 ENCOUNTER — Ambulatory Visit: Payer: Medicare Other | Admitting: Family Medicine

## 2014-12-28 ENCOUNTER — Ambulatory Visit: Payer: Medicare Other | Admitting: Family Medicine

## 2015-01-04 ENCOUNTER — Ambulatory Visit (INDEPENDENT_AMBULATORY_CARE_PROVIDER_SITE_OTHER): Payer: Medicare Other | Admitting: Family Medicine

## 2015-01-04 ENCOUNTER — Encounter: Payer: Self-pay | Admitting: Family Medicine

## 2015-01-04 VITALS — BP 136/80 | HR 81 | Temp 97.9°F | Resp 16 | Ht 63.0 in | Wt 123.2 lb

## 2015-01-04 DIAGNOSIS — I10 Essential (primary) hypertension: Secondary | ICD-10-CM | POA: Diagnosis not present

## 2015-01-04 DIAGNOSIS — F411 Generalized anxiety disorder: Secondary | ICD-10-CM | POA: Diagnosis not present

## 2015-01-04 DIAGNOSIS — E785 Hyperlipidemia, unspecified: Secondary | ICD-10-CM | POA: Diagnosis not present

## 2015-01-04 LAB — CBC WITH DIFFERENTIAL/PLATELET
Basophils Absolute: 0 10*3/uL (ref 0.0–0.1)
Basophils Relative: 0.8 % (ref 0.0–3.0)
EOS ABS: 0.2 10*3/uL (ref 0.0–0.7)
EOS PCT: 3.2 % (ref 0.0–5.0)
HEMATOCRIT: 41.8 % (ref 36.0–46.0)
HEMOGLOBIN: 13.8 g/dL (ref 12.0–15.0)
Lymphocytes Relative: 19.5 % (ref 12.0–46.0)
Lymphs Abs: 1.2 10*3/uL (ref 0.7–4.0)
MCHC: 33.1 g/dL (ref 30.0–36.0)
MCV: 96.5 fl (ref 78.0–100.0)
MONO ABS: 0.5 10*3/uL (ref 0.1–1.0)
Monocytes Relative: 8.8 % (ref 3.0–12.0)
NEUTROS ABS: 4.2 10*3/uL (ref 1.4–7.7)
Neutrophils Relative %: 67.7 % (ref 43.0–77.0)
PLATELETS: 273 10*3/uL (ref 150.0–400.0)
RBC: 4.33 Mil/uL (ref 3.87–5.11)
RDW: 14.1 % (ref 11.5–15.5)
WBC: 6.2 10*3/uL (ref 4.0–10.5)

## 2015-01-04 LAB — BASIC METABOLIC PANEL
BUN: 11 mg/dL (ref 6–23)
CO2: 33 mEq/L — ABNORMAL HIGH (ref 19–32)
Calcium: 9.3 mg/dL (ref 8.4–10.5)
Chloride: 100 mEq/L (ref 96–112)
Creatinine, Ser: 0.58 mg/dL (ref 0.40–1.20)
GFR: 105.57 mL/min (ref >60.00)
Glucose, Bld: 99 mg/dL (ref 70–99)
Potassium: 4 mEq/L (ref 3.5–5.1)
SODIUM: 138 meq/L (ref 135–145)

## 2015-01-04 LAB — HEPATIC FUNCTION PANEL
ALT: 11 U/L (ref 0–35)
AST: 15 U/L (ref 0–37)
Albumin: 3.9 g/dL (ref 3.5–5.2)
Alkaline Phosphatase: 63 U/L (ref 39–117)
BILIRUBIN TOTAL: 0.4 mg/dL (ref 0.2–1.2)
Bilirubin, Direct: 0.1 mg/dL (ref 0.0–0.3)
Total Protein: 7.6 g/dL (ref 6.0–8.3)

## 2015-01-04 LAB — LIPID PANEL
CHOL/HDL RATIO: 3
Cholesterol: 234 mg/dL — ABNORMAL HIGH (ref 0–200)
HDL: 75.8 mg/dL (ref >39.00)
LDL CALC: 146 mg/dL — AB (ref 0–99)
NONHDL: 158.38
Triglycerides: 61 mg/dL (ref 0.0–149.0)
VLDL: 12.2 mg/dL (ref 0.0–40.0)

## 2015-01-04 MED ORDER — TRAZODONE HCL 50 MG PO TABS: 25.0000 mg | ORAL_TABLET | Freq: Every evening | ORAL | Status: DC | PRN

## 2015-01-04 NOTE — Assessment & Plan Note (Signed)
Ongoing issue for pt due to her difficult marriage.  Pt is taking Zquil for sleep but now having memory issues- likely due to the diphenhydramine component.  Will stop OTC sleep aides and start low dose Trazodone.  Reviewed possible side effects.  Pt to call if problems.  Pt expressed understanding and is in agreement w/ plan.

## 2015-01-04 NOTE — Assessment & Plan Note (Signed)
Chronic problem.  Pt is intolerant to statins.  Doing well on Zetia but is upset about the cost.  Again reviewed that there is no similar alternative.  Check labs.  Adjust meds prn

## 2015-01-04 NOTE — Progress Notes (Signed)
   Subjective:    Patient ID: Alicia Joseph, female    DOB: [birth year], 81 y.o.   MRN: [id number]  HPI Hyperlipidemia- chronic problem, on Zetia due to statin intolerance.  Denies abd pain, N/V.  HTN- chronic problem, on Losartan w/ good control.  Denies CP, SOB, HAs, visual changes, edema.  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  She reports 'he does nothing', 'he destroys everything'.  'it's getting worse but I'm learning how to deal w/ it'.  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  Pt having difficulty sleeping.  Taking OTC Zquil but pt notes that since she started this, she is having memory issues.   Review of Systems For ROS see HPI     Objective:   Physical Exam  Constitutional: She is oriented to person, place, and time. She appears well-developed and well-nourished. No distress.  HENT:  Head: Normocephalic and atraumatic.  Eyes: Conjunctivae and EOM are normal. Pupils are equal, round, and reactive to light.  Neck: Normal range of motion. Neck supple. No thyromegaly present.  Cardiovascular: Normal rate, regular rhythm, normal heart sounds and intact distal pulses.   No murmur heard. Pulmonary/Chest: Effort normal and breath sounds normal. No respiratory distress.  Abdominal: Soft. She exhibits no distension. There is no tenderness.  Musculoskeletal: She exhibits no edema.  Lymphadenopathy:    She has no cervical adenopathy.  Neurological: She is alert and oriented to person, place, and time.  Skin: Skin is warm and dry.  Psychiatric: She has a normal mood and affect. Her behavior is normal.  Vitals reviewed.         Assessment & Plan:

## 2015-01-04 NOTE — Patient Instructions (Signed)
Schedule your complete physical in 6 months We'll notify you of your lab results and make any changes if needed Keep up the good work!  You look great!!! Start the Trazodone- 1/2 tab nightly.  You can increase to 1 tab as needed Call with any questions or concerns If you want to join [place] at the new [place] office, any scheduled appointments will automatically transfer and we will see you at [address] [place] [address] [place], [place] [address] Happy Fall!!!

## 2015-01-04 NOTE — Progress Notes (Signed)
Pre visit review using our clinic review tool, if applicable. No additional management support is needed unless otherwise documented below in the visit note. 

## 2015-01-04 NOTE — Assessment & Plan Note (Signed)
Chronic problem.  Well controlled today.  Asymptomatic.  Check labs.  No anticipated med changes. 

## 2015-01-05 ENCOUNTER — Encounter: Payer: Self-pay | Admitting: General Practice

## 2015-01-10 DIAGNOSIS — H5213 Myopia, bilateral: Secondary | ICD-10-CM | POA: Diagnosis not present

## 2015-02-13 ENCOUNTER — Other Ambulatory Visit: Payer: Self-pay | Admitting: Family Medicine

## 2015-02-13 NOTE — Telephone Encounter (Signed)
Medication filled to pharmacy as requested.   

## 2015-04-20 ENCOUNTER — Other Ambulatory Visit: Payer: Self-pay | Admitting: Family Medicine

## 2015-04-21 NOTE — Telephone Encounter (Signed)
Medication filled to pharmacy as requested.   

## 2015-07-13 ENCOUNTER — Encounter: Payer: Medicare Other | Admitting: Family Medicine

## 2015-07-18 ENCOUNTER — Encounter: Payer: Self-pay | Admitting: Family Medicine

## 2015-07-18 ENCOUNTER — Ambulatory Visit (INDEPENDENT_AMBULATORY_CARE_PROVIDER_SITE_OTHER): Payer: Medicare Other | Admitting: Family Medicine

## 2015-07-18 VITALS — BP 128/80 | HR 79 | Temp 98.1°F | Resp 17 | Ht 63.0 in | Wt 124.0 lb

## 2015-07-18 DIAGNOSIS — Z0000 Encounter for general adult medical examination without abnormal findings: Secondary | ICD-10-CM

## 2015-07-18 DIAGNOSIS — M8580 Other specified disorders of bone density and structure, unspecified site: Secondary | ICD-10-CM | POA: Diagnosis not present

## 2015-07-18 DIAGNOSIS — E785 Hyperlipidemia, unspecified: Secondary | ICD-10-CM

## 2015-07-18 DIAGNOSIS — I10 Essential (primary) hypertension: Secondary | ICD-10-CM | POA: Diagnosis not present

## 2015-07-18 NOTE — Progress Notes (Signed)
Pre visit review using our clinic review tool, if applicable. No additional management support is needed unless otherwise documented below in the visit note. 

## 2015-07-18 NOTE — Patient Instructions (Signed)
Follow up in 6-8 weeks to recheck mood and stress level Go get your labs done at [facility] and we'll notify you of your lab results Keep up the good work- you look great! You do not need another colonoscopy- yay!!! Call and schedule your mammogram at your convenience You are up to date on your immunizations- yay! Call with any questions or concerns Thanks for sticking with [place]! Hang in there!!!

## 2015-07-18 NOTE — Progress Notes (Signed)
   Subjective:    Patient ID: Alicia Joseph, female    DOB: [birth year], 82 y.o.   MRN: [id number]  HPI Here today for CPE.  Risk Factors: HTN- chronic problem, on Losartan Hyperlipidemia- chronic problem, on Zetia Physical Activity: very active Fall Risk: low Depression: ongoing issue for pt, a lot of family stress Hearing: normal to conversational tones, decreased to whispered voice ADL's: independent Cognitive: normal linear thought process, memory and attention intact Home Safety: safe at home Height, Weight, BMI, Visual Acuity: see vitals, vision corrected to 20/20 w/ glasses Counseling: UTD on colonoscopy- no need for repeat, due for mammo this month.  UTD on immunizations Care team reviewed and updated w/ pt Labs Ordered: See A&P Care Plan: See A&P    Review of Systems Patient reports no vision/ hearing changes, adenopathy,fever, weight change,  persistant/recurrent hoarseness , swallowing issues, chest pain, palpitations, edema, persistant/recurrent cough, hemoptysis, dyspnea (rest/exertional/paroxysmal nocturnal), gastrointestinal bleeding (melena, rectal bleeding), abdominal pain, significant heartburn, bowel changes, GU symptoms (dysuria, hematuria, incontinence), Gyn symptoms (abnormal  bleeding, pain),  syncope, focal weakness, memory loss, numbness & tingling, skin/hair/nail changes, abnormal bruising or bleeding.     Objective:   Physical Exam General Appearance:    Alert, cooperative, no distress, appears stated age  Head:    Normocephalic, without obvious abnormality, atraumatic  Eyes:    PERRL, conjunctiva/corneas clear, EOM's intact, fundi    benign, both eyes  Ears:    Normal TM's and external ear canals, both ears  Nose:   Nares normal, septum midline, mucosa normal, no drainage    or sinus tenderness  Throat:   Lips, mucosa, and tongue normal; teeth and gums normal  Neck:   Supple, symmetrical, trachea midline, no adenopathy;    Thyroid: no  enlargement/tenderness/nodules  Back:     Symmetric, no curvature, ROM normal, no CVA tenderness  Lungs:     Clear to auscultation bilaterally, respirations unlabored  Chest Wall:    No tenderness or deformity   Heart:    Regular rate and rhythm, S1 and S2 normal, II/VI SEM, no murmur, ru or gallop  Breast Exam:    Deferred to mammo  Abdomen:     Soft, non-tender, bowel sounds active all four quadrants,    no masses, no organomegaly  Genitalia:    Deferred  Rectal:    Extremities:   Extremities normal, atraumatic, no cyanosis or edema  Pulses:   2+ and symmetric all extremities  Skin:   Skin color, texture, turgor normal, no rashes or lesions  Lymph nodes:   Cervical, supraclavicular, and axillary nodes normal  Neurologic:   CNII-XII intact, normal strength, sensation and reflexes    throughout          Assessment & Plan:

## 2015-07-23 NOTE — Assessment & Plan Note (Signed)
Pt's PE unchanged from previous.  UTD on immunizations.  UTD on colonoscopy- no need for another.  Due for mammo- pt plans to schedule.  Pt changed her DPI information to reflect that she only wants her daughter in [place] participated in her care.  Check labs.  Anticipatory guidance provided.

## 2015-07-23 NOTE — Assessment & Plan Note (Signed)
Ongoing issue for pt.  On daily Ca and Vit D.  Check Vit D level and replete prn.

## 2015-07-23 NOTE — Assessment & Plan Note (Signed)
Chronic problem.  Well controlled.  Asymptomatic.  Check labs.  No anticipated med changes. 

## 2015-07-23 NOTE — Assessment & Plan Note (Signed)
Chronic problem.  Tolerating Zetia w/o difficulty.  Check labs.  Adjust meds prn  

## 2015-07-31 ENCOUNTER — Other Ambulatory Visit: Payer: Self-pay | Admitting: Family Medicine

## 2015-07-31 DIAGNOSIS — Z1231 Encounter for screening mammogram for malignant neoplasm of breast: Secondary | ICD-10-CM

## 2015-08-08 ENCOUNTER — Ambulatory Visit (HOSPITAL_BASED_OUTPATIENT_CLINIC_OR_DEPARTMENT_OTHER)
Admission: RE | Admit: 2015-08-08 | Discharge: 2015-08-08 | Disposition: A | Discharge status: Home / Self Care | Payer: Medicare Other | Source: Ambulatory Visit | Attending: Family Medicine | Admitting: Family Medicine

## 2015-08-08 DIAGNOSIS — Z1231 Encounter for screening mammogram for malignant neoplasm of breast: Secondary | ICD-10-CM | POA: Diagnosis not present

## 2015-08-18 ENCOUNTER — Other Ambulatory Visit: Payer: Self-pay | Admitting: General Practice

## 2015-08-18 MED ORDER — EZETIMIBE 10 MG PO TABS: 10.0000 mg | ORAL_TABLET | Freq: Every day | ORAL | Status: DC

## 2015-08-23 ENCOUNTER — Telehealth: Payer: Self-pay | Admitting: General Practice

## 2015-08-23 NOTE — Telephone Encounter (Signed)
When I spoke with pt in regards to a lab appt, and she said that she was charged for her mammogram, she has never been charged before. She is checking with insurance and she wanted me to route this message to [employer].

## 2015-09-04 ENCOUNTER — Ambulatory Visit: Payer: Medicare Other | Admitting: Family Medicine

## 2015-09-06 ENCOUNTER — Encounter: Payer: Self-pay | Admitting: General Practice

## 2015-09-06 ENCOUNTER — Other Ambulatory Visit (INDEPENDENT_AMBULATORY_CARE_PROVIDER_SITE_OTHER): Payer: Medicare Other

## 2015-09-06 DIAGNOSIS — E785 Hyperlipidemia, unspecified: Secondary | ICD-10-CM

## 2015-09-06 LAB — BASIC METABOLIC PANEL
BUN: 16 mg/dL (ref 6–23)
CALCIUM: 9.2 mg/dL (ref 8.4–10.5)
CO2: 27 meq/L (ref 19–32)
CREATININE: 0.57 mg/dL (ref 0.40–1.20)
Chloride: 99 mEq/L (ref 96–112)
GFR: 107.53 mL/min (ref >60.00)
GLUCOSE: 106 mg/dL — AB (ref 70–99)
Potassium: 4 mEq/L (ref 3.5–5.1)
Sodium: 134 mEq/L — ABNORMAL LOW (ref 135–145)

## 2015-09-06 LAB — CBC WITH DIFFERENTIAL/PLATELET
BASOS ABS: 0.1 10*3/uL (ref 0.0–0.1)
Basophils Relative: 0.7 % (ref 0.0–3.0)
EOS ABS: 0.2 10*3/uL (ref 0.0–0.7)
Eosinophils Relative: 2.4 % (ref 0.0–5.0)
HEMATOCRIT: 39.5 % (ref 36.0–46.0)
Hemoglobin: 13.1 g/dL (ref 12.0–15.0)
LYMPHS PCT: 15.9 % (ref 12.0–46.0)
Lymphs Abs: 1.4 10*3/uL (ref 0.7–4.0)
MCHC: 33.2 g/dL (ref 30.0–36.0)
MCV: 93.7 fl (ref 78.0–100.0)
Monocytes Absolute: 0.6 10*3/uL (ref 0.1–1.0)
Monocytes Relative: 6.8 % (ref 3.0–12.0)
NEUTROS ABS: 6.6 10*3/uL (ref 1.4–7.7)
NEUTROS PCT: 74.2 % (ref 43.0–77.0)
PLATELETS: 303 10*3/uL (ref 150.0–400.0)
RBC: 4.22 Mil/uL (ref 3.87–5.11)
RDW: 14.1 % (ref 11.5–15.5)
WBC: 8.9 10*3/uL (ref 4.0–10.5)

## 2015-09-06 LAB — HEPATIC FUNCTION PANEL
ALT: 10 U/L (ref 0–35)
AST: 15 U/L (ref 0–37)
Albumin: 3.9 g/dL (ref 3.5–5.2)
Alkaline Phosphatase: 60 U/L (ref 39–117)
BILIRUBIN DIRECT: 0.1 mg/dL (ref 0.0–0.3)
TOTAL PROTEIN: 7.4 g/dL (ref 6.0–8.3)
Total Bilirubin: 0.5 mg/dL (ref 0.2–1.2)

## 2015-09-06 LAB — LIPID PANEL
CHOLESTEROL: 217 mg/dL — AB (ref 0–200)
HDL: 77.9 mg/dL (ref >39.00)
LDL CALC: 127 mg/dL — AB (ref 0–99)
NonHDL: 139.02
TRIGLYCERIDES: 60 mg/dL (ref 0.0–149.0)
Total CHOL/HDL Ratio: 3
VLDL: 12 mg/dL (ref 0.0–40.0)

## 2015-09-06 LAB — TSH: TSH: 1.95 u[IU]/mL (ref 0.35–4.50)

## 2015-09-06 LAB — VITAMIN D 25 HYDROXY (VIT D DEFICIENCY, FRACTURES): VITD: 28.27 ng/mL — AB (ref 30.00–100.00)

## 2015-09-11 ENCOUNTER — Telehealth: Payer: Self-pay | Admitting: Family Medicine

## 2015-09-11 NOTE — Telephone Encounter (Signed)
Patient notified of PCP recommendations and is agreement and expresses an understanding.  

## 2015-09-11 NOTE — Telephone Encounter (Signed)
Pt states that she does not understand why her Rx zetia changed to ezetimibe, and asking if this is the same Rx, also if this is a genetic why does it cost the same. Pt would like a call back.

## 2015-11-01 ENCOUNTER — Other Ambulatory Visit: Payer: Self-pay | Admitting: Family Medicine

## 2016-01-16 DIAGNOSIS — Z961 Presence of intraocular lens: Secondary | ICD-10-CM | POA: Diagnosis not present

## 2016-01-16 DIAGNOSIS — H11152 Pinguecula, left eye: Secondary | ICD-10-CM | POA: Diagnosis not present

## 2016-01-16 DIAGNOSIS — H524 Presbyopia: Secondary | ICD-10-CM | POA: Diagnosis not present

## 2016-01-16 DIAGNOSIS — D3132 Benign neoplasm of left choroid: Secondary | ICD-10-CM | POA: Diagnosis not present

## 2016-01-16 DIAGNOSIS — H35033 Hypertensive retinopathy, bilateral: Secondary | ICD-10-CM | POA: Diagnosis not present

## 2016-02-08 ENCOUNTER — Emergency Department (HOSPITAL_COMMUNITY): Payer: Medicare Other

## 2016-02-08 ENCOUNTER — Emergency Department (HOSPITAL_COMMUNITY)
Admission: EM | Admit: 2016-02-08 | Discharge: 2016-02-09 | Disposition: A | Discharge status: Home / Self Care | Payer: Medicare Other | Attending: Emergency Medicine | Admitting: Emergency Medicine

## 2016-02-08 ENCOUNTER — Encounter (HOSPITAL_COMMUNITY): Payer: Self-pay | Admitting: Pharmacy Technician

## 2016-02-08 DIAGNOSIS — W19XXXA Unspecified fall, initial encounter: Secondary | ICD-10-CM

## 2016-02-08 DIAGNOSIS — H05229 Edema of unspecified orbit: Secondary | ICD-10-CM

## 2016-02-08 DIAGNOSIS — W1830XA Fall on same level, unspecified, initial encounter: Secondary | ICD-10-CM | POA: Insufficient documentation

## 2016-02-08 DIAGNOSIS — S8002XA Contusion of left knee, initial encounter: Secondary | ICD-10-CM | POA: Diagnosis not present

## 2016-02-08 DIAGNOSIS — M25512 Pain in left shoulder: Secondary | ICD-10-CM | POA: Diagnosis not present

## 2016-02-08 DIAGNOSIS — R220 Localized swelling, mass and lump, head: Secondary | ICD-10-CM | POA: Diagnosis not present

## 2016-02-08 DIAGNOSIS — I10 Essential (primary) hypertension: Secondary | ICD-10-CM | POA: Diagnosis not present

## 2016-02-08 DIAGNOSIS — H05222 Edema of left orbit: Secondary | ICD-10-CM | POA: Diagnosis not present

## 2016-02-08 DIAGNOSIS — S0993XA Unspecified injury of face, initial encounter: Secondary | ICD-10-CM | POA: Diagnosis present

## 2016-02-08 DIAGNOSIS — R55 Syncope and collapse: Secondary | ICD-10-CM | POA: Insufficient documentation

## 2016-02-08 DIAGNOSIS — R079 Chest pain, unspecified: Secondary | ICD-10-CM | POA: Diagnosis not present

## 2016-02-08 DIAGNOSIS — Y999 Unspecified external cause status: Secondary | ICD-10-CM | POA: Insufficient documentation

## 2016-02-08 DIAGNOSIS — Y92000 Kitchen of unspecified non-institutional (private) residence as  the place of occurrence of the external cause: Secondary | ICD-10-CM | POA: Diagnosis not present

## 2016-02-08 DIAGNOSIS — S8001XA Contusion of right knee, initial encounter: Secondary | ICD-10-CM | POA: Insufficient documentation

## 2016-02-08 DIAGNOSIS — S0083XA Contusion of other part of head, initial encounter: Secondary | ICD-10-CM | POA: Diagnosis not present

## 2016-02-08 DIAGNOSIS — Z7982 Long term (current) use of aspirin: Secondary | ICD-10-CM | POA: Insufficient documentation

## 2016-02-08 DIAGNOSIS — Y939 Activity, unspecified: Secondary | ICD-10-CM | POA: Insufficient documentation

## 2016-02-08 LAB — I-STAT TROPONIN, ED: TROPONIN I, POC: 0 ng/mL (ref 0.00–0.08)

## 2016-02-08 LAB — BASIC METABOLIC PANEL
Anion gap: 8 (ref 5–15)
BUN: 14 mg/dL (ref 6–20)
CALCIUM: 9.4 mg/dL (ref 8.9–10.3)
CO2: 26 mmol/L (ref 22–32)
CREATININE: 0.62 mg/dL (ref 0.44–1.00)
Chloride: 97 mmol/L — ABNORMAL LOW (ref 101–111)
GFR calc Af Amer: >60 mL/min (ref >60)
GLUCOSE: 97 mg/dL (ref 65–99)
Potassium: 4.2 mmol/L (ref 3.5–5.1)
SODIUM: 131 mmol/L — AB (ref 135–145)

## 2016-02-08 LAB — CBC
HCT: 38.4 % (ref 36.0–46.0)
Hemoglobin: 13.1 g/dL (ref 12.0–15.0)
MCH: 31.6 pg (ref 26.0–34.0)
MCHC: 34.1 g/dL (ref 30.0–36.0)
MCV: 92.8 fL (ref 78.0–100.0)
PLATELETS: 270 10*3/uL (ref 150–400)
RBC: 4.14 MIL/uL (ref 3.87–5.11)
RDW: 13.3 % (ref 11.5–15.5)
WBC: 9.6 10*3/uL (ref 4.0–10.5)

## 2016-02-08 NOTE — ED Provider Notes (Signed)
[facility] DEPT Provider Note   CSN: [id number] Arrival date & time: 02/08/16  1630     History   Chief Complaint Chief Complaint  Patient presents with  . Fall    HPI Alicia Joseph is a 82 y.o. female.  HPI   Patient is an 82 year old female with a history of HTN, hyperlipidemia, osteopenia who presents to the emergency department after a fall that occurred at roughly 8 PM last evening. Patient states she had no prodrome prior to falling. She is unsure of how long she was on the floor. Patient states she fell in her kitchen onto hard tile. Daughter was also present at the time of exam. Daughter states patient has fallen before roughly 8 months ago. She states at that time the pain patient had no prodrome. Patient is complaining of left eye orbital pain and swelling, moderate bruising around her left eye, blurred vision of left eye, left shoulder and rib pain. Left shoulder and rib pain is mild at rest, worse with movement, 5/10, nonradiating. Patient is not taking anything for pain. Patient denies chest pain, dizziness, numbness/timing, weakness, nausea, vomiting, abdominal pain, hematochezia, black tarry stools, dysuria, hematuria, seizures, recent medication change.  Past Medical History:  Diagnosis Date  . Chest pain   . FH: colonic polyps   . Hyperlipidemia   . Hypertension   . Menopause   . Osteopenia     Patient Active Problem List   Diagnosis Date Noted  . Broken nose 06/23/2013  . Facial laceration 06/23/2013  . Fall at home 06/23/2013  . Benign paroxysmal positional vertigo 01/25/2013  . Muscle spasm 08/27/2012  . Rib pain 12/04/2011  . Hypertension   . General medical examination 03/08/2011  . Hyperglycemia 03/08/2011  . Foot pain 10/23/2010  . UNSPECIFIED SLEEP APNEA 11/13/2009  . Anxiety state 11/10/2009  . HYPOXEMIA 11/10/2009  . BACK PAIN 02/15/2008  . Hyperlipidemia 01/14/2007  . MENOPAUSE, SURGICAL 01/14/2007  . Osteopenia 01/14/2007    Past  Surgical History:  Procedure Laterality Date  . ABDOMINAL HYSTERECTOMY    . CHOLECYSTECTOMY      OB History    No data available       Home Medications    Prior to Admission medications   Medication Sig Start Date End Date Taking? Authorizing Provider  aspirin EC 81 MG tablet Take 81 mg by mouth daily.   Yes Historical Provider, MD  ezetimibe (ZETIA) 10 MG tablet Take 1 tablet (10 mg total) by mouth daily. 08/18/15  Yes Midge Minium, MD  losartan (COZAAR) 100 MG tablet TAKE 1 TABLET BY MOUTH EVERY DAY 11/01/15  Yes Midge Minium, MD  HYDROcodone-acetaminophen (NORCO/VICODIN) 5-325 MG tablet Take 1 tablet by mouth every 6 (six) hours as needed. 02/09/16   Kalman Drape, PA    Family History Family History  Problem Relation Age of Onset  . Cancer Sister     breast  . Cancer Father     lung  . Diabetes Brother   . Cancer Daughter     thyroid    Social History Social History  Substance Use Topics  . Smoking status: Never Smoker  . Smokeless tobacco: Never Used  . Alcohol use Yes     Allergies   Atorvastatin and Ezetimibe-simvastatin   Review of Systems Review of Systems  Constitutional: Negative for fever.  Eyes: Positive for visual disturbance.  Respiratory: Negative for chest tightness and shortness of breath.   Cardiovascular: Negative for chest pain and  leg swelling.  Gastrointestinal: Negative for abdominal pain, nausea and vomiting.  Genitourinary: Negative for dysuria and hematuria.  Musculoskeletal: Positive for arthralgias. Negative for joint swelling, neck pain and neck stiffness.  Skin: Positive for wound.  Neurological: Positive for syncope. Negative for dizziness, seizures, weakness, numbness and headaches.  Psychiatric/Behavioral: Negative for confusion.  All other systems reviewed and are negative.    Physical Exam Updated Vital Signs BP (!) 219/98   Pulse 72   Temp 97.7 F (36.5 C) (Oral)   Resp 16   Ht 5\' 2"  (1.575 m)   Wt  62.1 kg   SpO2 99%   BMI 25.06 kg/m   Physical Exam  Constitutional: She appears well-developed and well-nourished. No distress.  HENT:  Head: Normocephalic and atraumatic.  Eyes: Conjunctivae are normal.  Neck: Normal range of motion. Neck supple. No spinous process tenderness and no muscular tenderness present. No neck rigidity.  Cardiovascular: Normal rate, regular rhythm, normal heart sounds and intact distal pulses.  Exam reveals no gallop and no friction rub.   No murmur heard. Pulses:      Radial pulses are 2+ on the right side, and 2+ on the left side.       Dorsalis pedis pulses are 2+ on the right side, and 2+ on the left side.  Pulmonary/Chest: Effort normal and breath sounds normal. No respiratory distress. She has no decreased breath sounds. She has no wheezes. She has no rhonchi. She has no rales.  Mild TTP of left lateral ribs  Abdominal: Soft. Normal appearance and bowel sounds are normal. There is no tenderness.  Musculoskeletal: Normal range of motion.  No deformity, ecchymosis, edema noted to LUE. Full range of motion. TTP to posterior shoulder in the acromion region. No TTP to elbow or wrist. Sensation intact. Grip strength 5/5 bilaterally. Patient's neurovascular intact distally bilaterally.  Small contusions noted to bilateral knees. Full range of motion of knees without TTP, deformities, edema. Sensation intact of BLE's. 2+ TB pulses bilaterally, strength 5/5 of plantar flexion and extension,  patient is neurovascularly intact distally.  Neurological: She is alert. She has normal strength. No cranial nerve deficit or sensory deficit. Coordination normal. GCS eye subscore is 4. GCS verbal subscore is 5. GCS motor subscore is 6.  Skin: Skin is warm and dry. She is not diaphoretic.  Psychiatric: She has a normal mood and affect. Her behavior is normal.  Nursing note and vitals reviewed.    ED Treatments / Results  Labs (all labs ordered are listed, but only  abnormal results are displayed) Labs Reviewed  BASIC METABOLIC PANEL - Abnormal; Notable for the following:       Result Value   Sodium 131 (*)    Chloride 97 (*)    All other components within normal limits  CBC  I-STAT TROPOININ, ED    EKG  EKG Interpretation  Date/Time:  Thursday February 08 2016 20:50:20 EST Ventricular Rate:  68 PR Interval:    QRS Duration: 124 QT Interval:  455 QTC Calculation: 484 R Axis:   47 Text Interpretation:  Sinus rhythm Nonspecific intraventricular conduction delay Nonspecific repol abnormality, diffuse leads Nonspecific T wave abnormality now evident in aVL Confirmed by [place] ([id number]) on 02/08/2016 8:54:26 PM       Radiology Dg Chest 2 View  Result Date: 02/08/2016 CLINICAL DATA:  Fall yesterday with left shoulder and chest pain. EXAM: CHEST  2 VIEW COMPARISON:  October 31, 2009 FINDINGS: The heart is borderline in  size. The hila are symmetric. The mediastinum is unremarkable with a torturous thoracic aorta. No pneumothorax. No pulmonary nodules or masses. There is irregularity of the inferior left glenoid with an adjacent radiodensity, possibly a fracture fragment. Recommend dedicated images of the left shoulder given history. IMPRESSION: Possible fracture off the inferior glenoid. Recommend dedicated images of the left shoulder. No abnormalities in the chest. Electronically Signed   By: Dorise Bullion III M.D   On: 02/08/2016 17:43   Ct Head Wo Contrast  Result Date: 02/08/2016 CLINICAL DATA:  Fall and hit face with bruising and swelling of left orbital area EXAM: CT HEAD WITHOUT CONTRAST CT MAXILLOFACIAL WITHOUT CONTRAST TECHNIQUE: Multidetector CT imaging of the head and maxillofacial structures were performed using the standard protocol without intravenous contrast. Multiplanar CT image reconstructions of the maxillofacial structures were also generated. COMPARISON:  None. FINDINGS: CT HEAD FINDINGS Brain: No acute territorial  infarction, intracranial hemorrhage, or extra-axial fluid collection is visualized. There is no focal mass, mass effect or midline shift. There is mild atrophy. The ventricles are similar in size and morphology compared to the prior study. Vascular: No hyperdense vessels. Carotid artery calcifications are present. Skull: Mastoid air cells are clear. There is no skull fracture. Mild mucosal thickening in the paranasal sinuses. Other: Nodular appearance of the nasal turbinates. Left frontal and periorbital soft tissue swelling. No acute orbital abnormality. CT MAXILLOFACIAL FINDINGS Osseous: Mild anterior subluxation of TMJs, chronic with flattened appearance of the mandibular heads. There is no evidence for mandibular fracture. Bilateral pterygoid plates and zygomatic arches are intact. The mastoid air cells are clear. Bilateral old nasal bone deformities. Suspect acute nondisplaced fracture of the left nasal process of the maxilla. Mild overlying soft tissue swelling. Orbits: No evidence for orbital fracture. There is no intra or extraconal soft tissue abnormality. The globes show no acute abnormalities. Sinuses: No fluid levels. Mild mucosal thickening in the ethmoid and maxillary sinuses. There is no sinus wall fracture visualized. Soft tissues: Moderate left periorbital soft tissue swelling. IMPRESSION: 1. No CT evidence for acute intracranial abnormality. 2. Suspect acute nondisplaced fracture involving the left nasal process of the maxillary bone. 3. Moderate left periorbital and forehead soft tissue hematoma. Electronically Signed   By: Donavan Foil M.D.   On: 02/08/2016 18:17   Ct Maxillofacial Wo Contrast  Result Date: 02/08/2016 CLINICAL DATA:  Fall and hit face with bruising and swelling of left orbital area EXAM: CT HEAD WITHOUT CONTRAST CT MAXILLOFACIAL WITHOUT CONTRAST TECHNIQUE: Multidetector CT imaging of the head and maxillofacial structures were performed using the standard protocol without  intravenous contrast. Multiplanar CT image reconstructions of the maxillofacial structures were also generated. COMPARISON:  None. FINDINGS: CT HEAD FINDINGS Brain: No acute territorial infarction, intracranial hemorrhage, or extra-axial fluid collection is visualized. There is no focal mass, mass effect or midline shift. There is mild atrophy. The ventricles are similar in size and morphology compared to the prior study. Vascular: No hyperdense vessels. Carotid artery calcifications are present. Skull: Mastoid air cells are clear. There is no skull fracture. Mild mucosal thickening in the paranasal sinuses. Other: Nodular appearance of the nasal turbinates. Left frontal and periorbital soft tissue swelling. No acute orbital abnormality. CT MAXILLOFACIAL FINDINGS Osseous: Mild anterior subluxation of TMJs, chronic with flattened appearance of the mandibular heads. There is no evidence for mandibular fracture. Bilateral pterygoid plates and zygomatic arches are intact. The mastoid air cells are clear. Bilateral old nasal bone deformities. Suspect acute nondisplaced fracture of the left nasal  process of the maxilla. Mild overlying soft tissue swelling. Orbits: No evidence for orbital fracture. There is no intra or extraconal soft tissue abnormality. The globes show no acute abnormalities. Sinuses: No fluid levels. Mild mucosal thickening in the ethmoid and maxillary sinuses. There is no sinus wall fracture visualized. Soft tissues: Moderate left periorbital soft tissue swelling. IMPRESSION: 1. No CT evidence for acute intracranial abnormality. 2. Suspect acute nondisplaced fracture involving the left nasal process of the maxillary bone. 3. Moderate left periorbital and forehead soft tissue hematoma. Electronically Signed   By: Donavan Foil M.D.   On: 02/08/2016 18:17    Procedures Procedures (including critical care time)  Medications Ordered in ED Medications  HYDROcodone-acetaminophen (NORCO/VICODIN) 5-325  MG per tablet 1 tablet (1 tablet Oral Given 02/09/16 0016)     Initial Impression / Assessment and Plan / ED Course  I have reviewed the triage vital signs and the nursing notes.  Pertinent labs & imaging results that were available during my care of the patient were reviewed by me and considered in my medical decision making (see chart for details).  Clinical Course    Patient presents after fall. GCS 15, no neurological deficits on exam. CT of head negative for any acute intracranial abnormalities. Patient is not anticoagulated. CT maxillofacial revealed suspect acute nondisplaced fracture involving the left nasal process of the maxillary bone. Patient pain was well-controlled ED she was placed in a sling. Xray chest revealed Possible fracture off the inferior glenoid. Pt was placed in a sling and instructed to f/u with orthopedics tomorrow. Discussed RICE and pain medicine.   Instructed pt to f/u with ENT for her facial fracture, ophthalmology for her blurred vision, and orthopedics for her possible inferior glenoid fracture.   EKG with new T wave inversions in aVL. Pt with mild hyponatremia and hypochloridemia. Daughter states no confusion or AMS. Other labs unremarkable. CT head negative. Troponin was negative. Unsure of etiology of pt's syncopal episodes. Instructed pt to f/u with PCP tomorrow for cardiac f/u for syncope. Pt on BP meds. BP elevated upon arrival to the ED. BP 185/81 at time of discharge. Instructed pt to discuss BP with PCP.  Discussed strict return Precautions to the ED. I discussed all of the results with the patient and family members they have expressed their understanding to the verbal discharge instructions.  Shared visit with Dr. Leonette Monarch.  Pt case discussed and pt seen by Dr. Leonette Monarch who agrees with the above plan.  Final Clinical Impressions(s) / ED Diagnoses   Final diagnoses:  Fall, initial encounter  Contusion of face, initial encounter  Acute pain of left  shoulder  Orbital swelling    New Prescriptions Discharge Medication List as of 02/09/2016 12:13 AM    START taking these medications   Details  HYDROcodone-acetaminophen (NORCO/VICODIN) 5-325 MG tablet Take 1 tablet by mouth every 6 (six) hours as needed., Starting Fri 02/09/2016, [place], [place] 02/10/16 [place], [place] 02/10/16 1521    Fatima Blank, MD 02/12/16 0140

## 2016-02-08 NOTE — ED Notes (Signed)
Otila Kluver (daughter) call if being discharged [phone]

## 2016-02-08 NOTE — ED Triage Notes (Signed)
Pt reports tripping and falling yesterday onto tile. States she is unsure if she lost consciousness after the fall. Pt with a hematoma to her L eye and pain to her left shoulder, clavicle and ribcage. Pt denies SOB. Pt is not taking a blood thinner.

## 2016-02-09 MED ORDER — HYDROCODONE-ACETAMINOPHEN 5-325 MG PO TABS: 1.0000 | ORAL_TABLET | Freq: Four times a day (QID) | ORAL | 0 refills | Status: DC | PRN

## 2016-02-09 MED ORDER — HYDROCODONE-ACETAMINOPHEN 5-325 MG PO TABS
1.0000 | ORAL_TABLET | Freq: Once | ORAL | Status: AC
  Administered 2016-02-09: 1 via ORAL
  Filled 2016-02-09: qty 1

## 2016-02-09 NOTE — Discharge Instructions (Signed)
Take the pain medication at bedtime. Follow up with your ophthalmologist tomorrow. Return to the emergency department if you experience worsening pain, dizziness, vomiting, weakness, numbness or any other concerning symptoms.

## 2016-02-09 NOTE — ED Provider Notes (Signed)
Medical screening examination/treatment/procedure(s) were conducted as a shared visit with non-physician practitioner(s) and myself.  I personally evaluated the patient during the encounter. Briefly, the patient is a 82 y.o. female presents facial pain and left shoulder pain after a syncopal episode that occurred yesterday. EKG with new T-wave inversions in aVL. Troponin negative. Since it has been more than 16 hours since the patient's episode, I feel that one troponin is sufficient to rule out cardiac ischemia. Rest of the labs are reassuring. Images to reveal evidence of facial fractures and a left radial fracture. Feel the patient is appropriate for discharge with strict return precautions and close PCP follow-up. Patient also will require follow-up with maxillofacial surgeon and orthopedic surgery.   EKG Interpretation  Date/Time:  Thursday February 08 2016 20:50:20 EST Ventricular Rate:  68 PR Interval:    QRS Duration: 124 QT Interval:  455 QTC Calculation: 484 R Axis:   47 Text Interpretation:  Sinus rhythm Nonspecific intraventricular conduction delay Nonspecific repol abnormality, diffuse leads Nonspecific T wave abnormality now evident in aVL Confirmed by [facility] MD, Medrith Veillon ([id number]) on 02/08/2016 8:54:26 PM           Fatima Blank, MD 02/09/16 [id number]

## 2016-02-12 ENCOUNTER — Telehealth: Payer: Self-pay | Admitting: Family Medicine

## 2016-02-12 NOTE — Telephone Encounter (Signed)
Relation to [id number] Call back [phone]   Reason for call:  Patient states she fell on Thursday and declined appointment and would like to speak with Robin directly.

## 2016-02-12 NOTE — Telephone Encounter (Signed)
Called left message to call back 

## 2016-02-13 NOTE — Telephone Encounter (Signed)
Happy to take her as a patient. Not sure when I can get her in soon. If she has no acute complaint just find her a follow up in the next couple of weeks. Notify previous PCP

## 2016-02-13 NOTE — Telephone Encounter (Signed)
Patient informed and did schedule her for 02/27/16 with new PCP. Sent Dr. Birdie Riddle a staff message regarding this change

## 2016-02-13 NOTE — Telephone Encounter (Signed)
Spoke to the patient and she did have a fall on 02/08/2016.  She was seen in the ED and they took very good care of her.  But she does need to followup in the office.  She was previously seen by Dr. Birdie Riddle, but unable to travel the distance to [place].  Would you be willing to become her PCP? If so we will need to schedule her soon to see you as a ED Followup. Let me know and I can do.

## 2016-02-19 DIAGNOSIS — S0012XA Contusion of left eyelid and periocular area, initial encounter: Secondary | ICD-10-CM | POA: Diagnosis not present

## 2016-02-19 DIAGNOSIS — R52 Pain, unspecified: Secondary | ICD-10-CM | POA: Diagnosis not present

## 2016-02-19 DIAGNOSIS — H1131 Conjunctival hemorrhage, right eye: Secondary | ICD-10-CM | POA: Diagnosis not present

## 2016-02-21 ENCOUNTER — Telehealth: Payer: Self-pay | Admitting: Family Medicine

## 2016-02-21 NOTE — Telephone Encounter (Signed)
Patient is requesting a call back from [place], she did not leave any further information. Please advise.   Patient phone: [phone]

## 2016-02-22 ENCOUNTER — Ambulatory Visit (INDEPENDENT_AMBULATORY_CARE_PROVIDER_SITE_OTHER): Payer: Medicare Other | Admitting: Family Medicine

## 2016-02-22 ENCOUNTER — Encounter: Payer: Self-pay | Admitting: Family Medicine

## 2016-02-22 VITALS — BP 141/82 | HR 74 | Temp 98.1°F | Resp 16 | Ht 62.0 in | Wt 133.4 lb

## 2016-02-22 DIAGNOSIS — S42142A Displaced fracture of glenoid cavity of scapula, left shoulder, initial encounter for closed fracture: Secondary | ICD-10-CM | POA: Diagnosis not present

## 2016-02-22 DIAGNOSIS — W19XXXD Unspecified fall, subsequent encounter: Secondary | ICD-10-CM | POA: Diagnosis not present

## 2016-02-22 DIAGNOSIS — Y92099 Unspecified place in other non-institutional residence as the place of occurrence of the external cause: Secondary | ICD-10-CM

## 2016-02-22 DIAGNOSIS — S42152A Displaced fracture of neck of scapula, left shoulder, initial encounter for closed fracture: Secondary | ICD-10-CM

## 2016-02-22 DIAGNOSIS — E785 Hyperlipidemia, unspecified: Secondary | ICD-10-CM

## 2016-02-22 DIAGNOSIS — Y92009 Unspecified place in unspecified non-institutional (private) residence as the place of occurrence of the external cause: Secondary | ICD-10-CM

## 2016-02-22 DIAGNOSIS — I10 Essential (primary) hypertension: Secondary | ICD-10-CM | POA: Diagnosis not present

## 2016-02-22 LAB — BASIC METABOLIC PANEL
BUN: 12 mg/dL (ref 7–25)
CO2: 27 mmol/L (ref 20–31)
CREATININE: 0.57 mg/dL — AB (ref 0.60–0.88)
Calcium: 9.4 mg/dL (ref 8.6–10.4)
Chloride: 98 mmol/L (ref 98–110)
GLUCOSE: 95 mg/dL (ref 65–99)
POTASSIUM: 3.9 mmol/L (ref 3.5–5.3)
Sodium: 133 mmol/L — ABNORMAL LOW (ref 135–146)

## 2016-02-22 LAB — CBC WITH DIFFERENTIAL/PLATELET
BASOS PCT: 0 %
Basophils Absolute: 0 cells/uL (ref 0–200)
EOS PCT: 4 %
Eosinophils Absolute: 492 cells/uL (ref 15–500)
HCT: 39.7 % (ref 35.0–45.0)
Hemoglobin: 13 g/dL (ref 11.7–15.5)
LYMPHS ABS: 1845 {cells}/uL (ref 850–3900)
LYMPHS PCT: 15 %
MCH: 31.3 pg (ref 27.0–33.0)
MCHC: 32.7 g/dL (ref 32.0–36.0)
MCV: 95.7 fL (ref 80.0–100.0)
MPV: 9.5 fL (ref 7.5–12.5)
Monocytes Absolute: 984 cells/uL — ABNORMAL HIGH (ref 200–950)
Monocytes Relative: 8 %
NEUTROS PCT: 73 %
Neutro Abs: 8979 cells/uL — ABNORMAL HIGH (ref 1500–7800)
Platelets: 302 10*3/uL (ref 140–400)
RBC: 4.15 MIL/uL (ref 3.80–5.10)
RDW: 13.7 % (ref 11.0–15.0)
WBC: 12.3 10*3/uL — AB (ref 3.8–10.8)

## 2016-02-22 LAB — LIPID PANEL
CHOL/HDL RATIO: 2.9 ratio (ref <5.0)
CHOLESTEROL: 219 mg/dL — AB (ref <200)
HDL: 75 mg/dL (ref >50)
LDL Cholesterol: 110 mg/dL — ABNORMAL HIGH (ref <100)
TRIGLYCERIDES: 169 mg/dL — AB (ref <150)
VLDL: 34 mg/dL — AB (ref <30)

## 2016-02-22 LAB — HEPATIC FUNCTION PANEL
ALBUMIN: 4.1 g/dL (ref 3.6–5.1)
ALT: 9 U/L (ref 6–29)
AST: 14 U/L (ref 10–35)
Alkaline Phosphatase: 77 U/L (ref 33–130)
BILIRUBIN TOTAL: 0.5 mg/dL (ref 0.2–1.2)
Bilirubin, Direct: 0.1 mg/dL (ref <=0.2)
Indirect Bilirubin: 0.4 mg/dL (ref 0.2–1.2)
Total Protein: 7.2 g/dL (ref 6.1–8.1)

## 2016-02-22 MED ORDER — HYDROCODONE-ACETAMINOPHEN 5-325 MG PO TABS: 1.0000 | ORAL_TABLET | Freq: Four times a day (QID) | ORAL | 0 refills | Status: DC | PRN

## 2016-02-22 NOTE — Patient Instructions (Signed)
Schedule your complete physical in 6 months We'll notify you of your lab results and make any changes if needed Take the Hydrocodone as needed for pain Follow up with orthopedics for your shoulder Call with any questions or concerns Happy Holidays!!!

## 2016-02-22 NOTE — Telephone Encounter (Signed)
Patient called to explain why she had to cancel appt. Next week with Dr. Charlett Blake. She did have a fall and felt needed to be seen sooner that appt with Dr. Charlett Blake. She is seeing Dr. Birdie Riddle today and will keep new patient appt. In February 2018 with Dr. Charlett Blake.

## 2016-02-22 NOTE — Progress Notes (Signed)
Pre visit review using our clinic review tool, if applicable. No additional management support is needed unless otherwise documented below in the visit note. 

## 2016-02-22 NOTE — Progress Notes (Signed)
   Subjective:    Patient ID: Alicia Joseph, female    DOB: 1929-11-11, 82 y.o.   MRN: [id number]  HPI ER f/u- pt fell 11/23 after possible passing out (I don't remember falling) and had fx of L inferior glenoid and L nasal facial fx.  She was to f/u w/ ENT (which pt declined) and ortho (appt next week).  Pt was seen by eye doctor who cleared her.  Pt was d/c'd on Hydrocodone which ran out after 3 days.  Pt reports her whole L side is sore after the fall.  No hx of passing out- but husband died ~3 weeks ago (1 week prior to fall).  Pt feels fall was due to high stress levels, she hadn't eaten.  Daughter feels that pt fell on uneven [place] tiles in her kitchen rather than passing out first- apparently pt was initially able to describe her fall .  HTN- chronic problem, on Losartan daily w/ adequate BP control today.  Denies CP, SOB  Hyperlipidemia- chronic problem, on Zetia for cholesterol.  Denies abd pain, N/V.   Review of Systems For ROS see HPI     Objective:   Physical Exam  Constitutional: She is oriented to person, place, and time. She appears well-developed and well-nourished. No distress.  HENT:  Head: Normocephalic.  L facial bruising Mild TTP over L nasal bridge  Eyes: EOM are normal. Pupils are equal, round, and reactive to light.  R subconjunctival hemorrhage  Neck: Normal range of motion. Neck supple. No thyromegaly present.  Cardiovascular: Normal rate, regular rhythm, normal heart sounds and intact distal pulses.   No murmur heard. Pulmonary/Chest: Effort normal and breath sounds normal. No respiratory distress.  Abdominal: Soft. She exhibits no distension. There is no tenderness.  Musculoskeletal: She exhibits no edema.  Lymphadenopathy:    She has no cervical adenopathy.  Neurological: She is alert and oriented to person, place, and time.  Skin: Skin is warm and dry.  Psychiatric: She has a normal mood and affect. Her behavior is normal.  Vitals  reviewed.         Assessment & Plan:

## 2016-02-23 ENCOUNTER — Other Ambulatory Visit: Payer: Self-pay | Admitting: Family Medicine

## 2016-02-23 DIAGNOSIS — R7989 Other specified abnormal findings of blood chemistry: Secondary | ICD-10-CM

## 2016-02-25 NOTE — Assessment & Plan Note (Signed)
New.  Occurred due to fall at home.  Has appt upcoming w/ Raliegh Ip.  Will refill her hydrocodone today for ongoing relief of pain.  Pt to use caution as narcotics can cause confusion, dizziness, and increased risk of falls.  Will follow.

## 2016-02-25 NOTE — Assessment & Plan Note (Signed)
Chronic problem.  Tolerating Zetia w/o difficuty.  Check labs.  Adjust meds prn

## 2016-02-25 NOTE — Assessment & Plan Note (Signed)
New.  There was some question as to whether pt fell on her kitchen tile floor or if she passed out- causing the fall.  Pt and daughter feel it was more of a distracted trip due to her husband's recent passing and that she had not eaten.  No need to pursue a syncopal work up at this time as pt has been asymptomatic since the fall.  Encouraged increased fluid intake, eating regularly, and changing positions slowly.  Will follow.

## 2016-02-25 NOTE — Assessment & Plan Note (Signed)
Chronic problem.  BP is adequately controlled today as she is still in pain.  I fear that if we aim for tighter BP control, this will increase her risk of hypotension and falls.  No med changes at this time.  Check labs.  Will follow.

## 2016-02-27 ENCOUNTER — Ambulatory Visit: Payer: Medicare Other | Admitting: Family Medicine

## 2016-03-27 ENCOUNTER — Other Ambulatory Visit: Payer: Self-pay | Admitting: Family Medicine

## 2016-03-31 ENCOUNTER — Other Ambulatory Visit: Payer: Self-pay | Admitting: Family Medicine

## 2016-05-13 ENCOUNTER — Ambulatory Visit (INDEPENDENT_AMBULATORY_CARE_PROVIDER_SITE_OTHER): Payer: Medicare Other

## 2016-05-13 ENCOUNTER — Ambulatory Visit (HOSPITAL_COMMUNITY)
Admission: EM | Admit: 2016-05-13 | Discharge: 2016-05-13 | Disposition: A | Discharge status: Home / Self Care | Payer: Medicare Other | Attending: Family Medicine | Admitting: Family Medicine

## 2016-05-13 ENCOUNTER — Encounter (HOSPITAL_COMMUNITY): Payer: Self-pay | Admitting: Family Medicine

## 2016-05-13 DIAGNOSIS — B9789 Other viral agents as the cause of diseases classified elsewhere: Secondary | ICD-10-CM | POA: Diagnosis not present

## 2016-05-13 DIAGNOSIS — R0602 Shortness of breath: Secondary | ICD-10-CM | POA: Diagnosis not present

## 2016-05-13 DIAGNOSIS — J069 Acute upper respiratory infection, unspecified: Secondary | ICD-10-CM

## 2016-05-13 DIAGNOSIS — R05 Cough: Secondary | ICD-10-CM | POA: Diagnosis not present

## 2016-05-13 MED ORDER — IPRATROPIUM BROMIDE 0.06 % NA SOLN: 2.0000 | Freq: Four times a day (QID) | NASAL | 1 refills | Status: DC

## 2016-05-13 NOTE — Discharge Instructions (Signed)
Drink plenty of fluids as discussed, use medicine as prescribed, and mucinex or delsym for cough. Return or see your doctor if further problems °

## 2016-05-13 NOTE — ED Provider Notes (Signed)
[facility]    CSN: [id number] Arrival date & time: 05/13/16  1441     History   Chief Complaint Chief Complaint  Patient presents with  . Cough    HPI Alicia Joseph is a 83 y.o. female.   The history is provided by the patient and a relative.  Cough  Cough characteristics:  Non-productive Severity:  Mild Onset quality:  Gradual Duration:  2 days Chronicity:  New Smoker: no   Context: sick contacts, upper respiratory infection and weather changes   Relieved by:  Nothing Worsened by:  Nothing Ineffective treatments:  None tried Associated symptoms: rhinorrhea and sinus congestion   Associated symptoms: no fever     Past Medical History:  Diagnosis Date  . Chest pain   . FH: colonic polyps   . Hyperlipidemia   . Hypertension   . Menopause   . Osteopenia     Patient Active Problem List   Diagnosis Date Noted  . Glenoid fracture of shoulder, left, closed, initial encounter 02/22/2016  . Broken nose 06/23/2013  . Facial laceration 06/23/2013  . Fall at home 06/23/2013  . Benign paroxysmal positional vertigo 01/25/2013  . Muscle spasm 08/27/2012  . Rib pain 12/04/2011  . Hypertension   . General medical examination 03/08/2011  . Hyperglycemia 03/08/2011  . Foot pain 10/23/2010  . UNSPECIFIED SLEEP APNEA 11/13/2009  . Anxiety state 11/10/2009  . HYPOXEMIA 11/10/2009  . BACK PAIN 02/15/2008  . Hyperlipidemia 01/14/2007  . MENOPAUSE, SURGICAL 01/14/2007  . Osteopenia 01/14/2007    Past Surgical History:  Procedure Laterality Date  . ABDOMINAL HYSTERECTOMY    . CHOLECYSTECTOMY      OB History    No data available       Home Medications    Prior to Admission medications   Medication Sig Start Date End Date Taking? Authorizing Provider  aspirin EC 81 MG tablet Take 81 mg by mouth daily.    Historical Provider, MD  ezetimibe (ZETIA) 10 MG tablet TAKE 1 TABLET (10 MG TOTAL) BY MOUTH DAILY. 03/27/16   Midge Minium, MD    HYDROcodone-acetaminophen (NORCO/VICODIN) 5-325 MG tablet Take 1 tablet by mouth every 6 (six) hours as needed. 02/22/16   Midge Minium, MD  ipratropium (ATROVENT) 0.06 % nasal spray Place 2 sprays into both nostrils 4 (four) times daily. 05/13/16   Billy Fischer, MD  losartan (COZAAR) 100 MG tablet TAKE 1 TABLET BY MOUTH EVERY DAY 04/02/16   Midge Minium, MD    Family History Family History  Problem Relation Age of Onset  . Cancer Sister     breast  . Cancer Father     lung  . Diabetes Brother   . Cancer Daughter     thyroid    Social History Social History  Substance Use Topics  . Smoking status: Never Smoker  . Smokeless tobacco: Never Used  . Alcohol use Yes     Allergies   Atorvastatin and Ezetimibe-simvastatin   Review of Systems Review of Systems  Constitutional: Negative for fever.  HENT: Positive for congestion, postnasal drip and rhinorrhea.   Respiratory: Positive for cough.   All other systems reviewed and are negative.    Physical Exam Triage Vital Signs ED Triage Vitals [05/13/16 1506]  Enc Vitals Group     BP 189/66     Pulse Rate 71     Resp 18     Temp 98.5 F (36.9 C)  Temp src      SpO2 95 %     Weight      Height      Head Circumference      Peak Flow      Pain Score      Pain Loc      Pain Edu?      Excl. in [place]?    No data found.   Updated Vital Signs BP 189/66   Pulse 71   Temp 98.5 F (36.9 C)   Resp 18   SpO2 95%   Visual Acuity Right Eye Distance:   Left Eye Distance:   Bilateral Distance:    Right Eye Near:   Left Eye Near:    Bilateral Near:     Physical Exam  Constitutional: She is oriented to person, place, and time. She appears well-developed and well-nourished. No distress.  HENT:  Right Ear: External ear normal.  Left Ear: External ear normal.  Mouth/Throat: Oropharynx is clear and moist.  Eyes: EOM are normal. Pupils are equal, round, and reactive to light.  Neck: Normal range of  motion.  Cardiovascular: Normal rate and regular rhythm.   Pulmonary/Chest: Effort normal and breath sounds normal.  Abdominal: Soft. Bowel sounds are normal.  Lymphadenopathy:    She has no cervical adenopathy.  Neurological: She is alert and oriented to person, place, and time.  Skin: Skin is warm and dry.  Nursing note and vitals reviewed.    UC Treatments / Results  Labs (all labs ordered are listed, but only abnormal results are displayed) Labs Reviewed - No data to display  EKG  EKG Interpretation None       Radiology Dg Chest 2 View  Result Date: 05/13/2016 CLINICAL DATA:  Three days of nonproductive cough, sore throat, anorexia, diarrhea, shortness of breath, and chest congestion. Patient's caregiver has a bronchitic -flu syndrome. EXAM: CHEST  2 VIEW COMPARISON:  Chest x-ray of February 08, 2016 FINDINGS: The lungs are well-expanded. There is no focal infiltrate. The interstitial markings are coarse though stable. The heart and pulmonary vascularity are normal. The mediastinum is normal in width. There is calcification in the wall of the aortic arch. The bony thorax exhibits no acute abnormality. IMPRESSION: Mild chronic bronchitic changes, stable. No pneumonia, CHF, nor other acute cardiopulmonary abnormality. Thoracic aortic atherosclerosis. Electronically Signed   By: David  Martinique M.D.   On: 05/13/2016 16:32   X-rays reviewed and report per radiologist.  Procedures Procedures (including critical care time)  Medications Ordered in UC Medications - No data to display   Initial Impression / Assessment and Plan / UC Course  I have reviewed the triage vital signs and the nursing notes.  Pertinent labs & imaging results that were available during my care of the patient were reviewed by me and considered in my medical decision making (see chart for details).       Final Clinical Impressions(s) / UC Diagnoses   Final diagnoses:  Viral URI with cough    New  Prescriptions New Prescriptions   IPRATROPIUM (ATROVENT) 0.06 % NASAL SPRAY    Place 2 sprays into both nostrils 4 (four) times daily.     Billy Fischer, MD 05/13/16 [phone]

## 2016-05-13 NOTE — ED Triage Notes (Signed)
Pt here for cough. sts slight fever and sore throat.

## 2016-07-08 ENCOUNTER — Telehealth: Payer: Self-pay | Admitting: Family Medicine

## 2016-07-08 NOTE — Telephone Encounter (Signed)
Disregard, pt made appt.

## 2016-07-17 ENCOUNTER — Ambulatory Visit (INDEPENDENT_AMBULATORY_CARE_PROVIDER_SITE_OTHER): Payer: Medicare Other | Admitting: Family Medicine

## 2016-07-17 ENCOUNTER — Encounter: Payer: Self-pay | Admitting: Family Medicine

## 2016-07-17 VITALS — BP 153/88 | HR 71 | Temp 98.0°F | Resp 16 | Ht 62.0 in | Wt 134.5 lb

## 2016-07-17 DIAGNOSIS — I10 Essential (primary) hypertension: Secondary | ICD-10-CM

## 2016-07-17 DIAGNOSIS — E785 Hyperlipidemia, unspecified: Secondary | ICD-10-CM | POA: Diagnosis not present

## 2016-07-17 DIAGNOSIS — R413 Other amnesia: Secondary | ICD-10-CM | POA: Diagnosis not present

## 2016-07-17 MED ORDER — LOSARTAN POTASSIUM 100 MG PO TABS: 100.0000 mg | ORAL_TABLET | Freq: Every day | ORAL | 3 refills | Status: DC

## 2016-07-17 MED ORDER — EZETIMIBE 10 MG PO TABS: 10.0000 mg | ORAL_TABLET | Freq: Every day | ORAL | 6 refills | Status: AC

## 2016-07-17 NOTE — Patient Instructions (Signed)
Follow up in 4-6 weeks to recheck BP, cholesterol, and memory START the Losartan once daily for blood pressure control START the Zetia once daily for cholesterol Try and find a stress outlet and take some time for yourself Call with any questions or concerns Happy Mother's Day!

## 2016-07-17 NOTE — Progress Notes (Signed)
Pre visit review using our clinic review tool, if applicable. No additional management support is needed unless otherwise documented below in the visit note. 

## 2016-07-17 NOTE — Assessment & Plan Note (Signed)
Deteriorated.  Pt stopped all meds in the time surrounding her husband's death.  She is asymptomatic but BP is elevated.  Restart Losartan and have her return shortly for f/u.  Pt expressed understanding and is in agreement w/ plan.

## 2016-07-17 NOTE — Progress Notes (Signed)
   Subjective:    Patient ID: Alicia Joseph, female    DOB: 11/25/1929, 83 y.o.   MRN: [id number]  HPI HTN- chronic problem, BP has deteriorated but pt reports she stopped all meds 6 months ago.  Previously on Losartan 100mg  daily.  Denies CP, SOB, HAs, visual changes, edema.  Hyperlipidemia- chronic problem, pt was previously on Zetia 10mg  daily but again has stopped all meds.  Pt is looking forward to getting back into the garden but has not been very active recently.  No abd pain, N/V.  Memory loss- pt reports she is having difficulty w/ short term memory since her husband died.  She reports sxs started ~6 months ago.  States she is overwhelmed with trying to complete everything she needs to get done.   Review of Systems For ROS see HPI     Objective:   Physical Exam  Constitutional: She is oriented to person, place, and time. She appears well-developed and well-nourished. No distress.  HENT:  Head: Normocephalic and atraumatic.  Eyes: Conjunctivae and EOM are normal. Pupils are equal, round, and reactive to light.  Neck: Normal range of motion. Neck supple. No thyromegaly present.  Cardiovascular: Normal rate, regular rhythm, normal heart sounds and intact distal pulses.   No murmur heard. Pulmonary/Chest: Effort normal and breath sounds normal. No respiratory distress.  Abdominal: Soft. She exhibits no distension. There is no tenderness.  Musculoskeletal: She exhibits no edema.  Lymphadenopathy:    She has no cervical adenopathy.  Neurological: She is alert and oriented to person, place, and time.  Skin: Skin is warm and dry.  Psychiatric: She has a normal mood and affect. Her behavior is normal.  Vitals reviewed.         Assessment & Plan:

## 2016-07-17 NOTE — Assessment & Plan Note (Signed)
New.  Pt reports sxs started around the time of her husband's death.  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  Discussed need for her to take time for herself, reach out to others for help, etc.  I suspect sxs will improve as her to-do list whittles down.  Will follow closely and refer for neuropsych testing if no improvement.

## 2016-07-17 NOTE — Assessment & Plan Note (Signed)
Chronic problem.  Stopped her medication 6 months ago.  Restart Zetia and will repeat labs in the future to make sure lipids are at goal.  Pt expressed understanding and is in agreement w/ plan.

## 2016-07-27 ENCOUNTER — Encounter (HOSPITAL_COMMUNITY): Payer: Self-pay | Admitting: *Deleted

## 2016-07-27 ENCOUNTER — Emergency Department (HOSPITAL_COMMUNITY): Payer: Medicare Other

## 2016-07-27 ENCOUNTER — Observation Stay (HOSPITAL_COMMUNITY)
Admission: EM | Admit: 2016-07-27 | Discharge: 2016-07-29 | Disposition: A | Discharge status: Home Health | Payer: Medicare Other | Attending: Internal Medicine | Admitting: Internal Medicine

## 2016-07-27 DIAGNOSIS — S3993XA Unspecified injury of pelvis, initial encounter: Secondary | ICD-10-CM | POA: Diagnosis not present

## 2016-07-27 DIAGNOSIS — W010XXA Fall on same level from slipping, tripping and stumbling without subsequent striking against object, initial encounter: Secondary | ICD-10-CM | POA: Diagnosis not present

## 2016-07-27 DIAGNOSIS — S299XXA Unspecified injury of thorax, initial encounter: Secondary | ICD-10-CM | POA: Diagnosis not present

## 2016-07-27 DIAGNOSIS — S4290XA Fracture of unspecified shoulder girdle, part unspecified, initial encounter for closed fracture: Secondary | ICD-10-CM | POA: Insufficient documentation

## 2016-07-27 DIAGNOSIS — S59912A Unspecified injury of left forearm, initial encounter: Secondary | ICD-10-CM | POA: Diagnosis not present

## 2016-07-27 DIAGNOSIS — R262 Difficulty in walking, not elsewhere classified: Secondary | ICD-10-CM | POA: Diagnosis not present

## 2016-07-27 DIAGNOSIS — M25511 Pain in right shoulder: Secondary | ICD-10-CM | POA: Diagnosis not present

## 2016-07-27 DIAGNOSIS — E876 Hypokalemia: Secondary | ICD-10-CM | POA: Diagnosis not present

## 2016-07-27 DIAGNOSIS — I10 Essential (primary) hypertension: Secondary | ICD-10-CM | POA: Diagnosis not present

## 2016-07-27 DIAGNOSIS — Y92009 Unspecified place in unspecified non-institutional (private) residence as the place of occurrence of the external cause: Secondary | ICD-10-CM

## 2016-07-27 DIAGNOSIS — W19XXXA Unspecified fall, initial encounter: Secondary | ICD-10-CM

## 2016-07-27 DIAGNOSIS — S42292A Other displaced fracture of upper end of left humerus, initial encounter for closed fracture: Secondary | ICD-10-CM

## 2016-07-27 DIAGNOSIS — R2681 Unsteadiness on feet: Secondary | ICD-10-CM | POA: Insufficient documentation

## 2016-07-27 DIAGNOSIS — S42212A Unspecified displaced fracture of surgical neck of left humerus, initial encounter for closed fracture: Secondary | ICD-10-CM | POA: Diagnosis not present

## 2016-07-27 DIAGNOSIS — M25512 Pain in left shoulder: Secondary | ICD-10-CM | POA: Diagnosis not present

## 2016-07-27 DIAGNOSIS — T148XXA Other injury of unspecified body region, initial encounter: Secondary | ICD-10-CM | POA: Diagnosis not present

## 2016-07-27 DIAGNOSIS — M25519 Pain in unspecified shoulder: Secondary | ICD-10-CM | POA: Insufficient documentation

## 2016-07-27 DIAGNOSIS — M79602 Pain in left arm: Secondary | ICD-10-CM

## 2016-07-27 DIAGNOSIS — E785 Hyperlipidemia, unspecified: Secondary | ICD-10-CM | POA: Diagnosis not present

## 2016-07-27 DIAGNOSIS — S8992XA Unspecified injury of left lower leg, initial encounter: Secondary | ICD-10-CM | POA: Diagnosis not present

## 2016-07-27 DIAGNOSIS — D62 Acute posthemorrhagic anemia: Secondary | ICD-10-CM

## 2016-07-27 DIAGNOSIS — S4991XA Unspecified injury of right shoulder and upper arm, initial encounter: Secondary | ICD-10-CM | POA: Diagnosis not present

## 2016-07-27 DIAGNOSIS — M79622 Pain in left upper arm: Secondary | ICD-10-CM | POA: Diagnosis not present

## 2016-07-27 DIAGNOSIS — S42213A Unspecified displaced fracture of surgical neck of unspecified humerus, initial encounter for closed fracture: Secondary | ICD-10-CM | POA: Diagnosis present

## 2016-07-27 LAB — BASIC METABOLIC PANEL
ANION GAP: 8 (ref 5–15)
BUN: 9 mg/dL (ref 6–20)
CHLORIDE: 104 mmol/L (ref 101–111)
CO2: 22 mmol/L (ref 22–32)
CREATININE: 0.54 mg/dL (ref 0.44–1.00)
Calcium: 8.2 mg/dL — ABNORMAL LOW (ref 8.9–10.3)
GFR calc non Af Amer: >60 mL/min (ref >60)
Glucose, Bld: 127 mg/dL — ABNORMAL HIGH (ref 65–99)
POTASSIUM: 3.3 mmol/L — AB (ref 3.5–5.1)
SODIUM: 134 mmol/L — AB (ref 135–145)

## 2016-07-27 LAB — CBC WITH DIFFERENTIAL/PLATELET
BASOS PCT: 0 %
Basophils Absolute: 0 10*3/uL (ref 0.0–0.1)
Eosinophils Absolute: 0 10*3/uL (ref 0.0–0.7)
Eosinophils Relative: 0 %
HEMATOCRIT: 35.5 % — AB (ref 36.0–46.0)
HEMOGLOBIN: 11.8 g/dL — AB (ref 12.0–15.0)
Lymphocytes Relative: 6 %
Lymphs Abs: 0.8 10*3/uL (ref 0.7–4.0)
MCH: 30.5 pg (ref 26.0–34.0)
MCHC: 33.2 g/dL (ref 30.0–36.0)
MCV: 91.7 fL (ref 78.0–100.0)
Monocytes Absolute: 1.1 10*3/uL — ABNORMAL HIGH (ref 0.1–1.0)
Monocytes Relative: 7 %
NEUTROS ABS: 13.1 10*3/uL — AB (ref 1.7–7.7)
NEUTROS PCT: 87 %
Platelets: 246 10*3/uL (ref 150–400)
RBC: 3.87 MIL/uL (ref 3.87–5.11)
RDW: 12.8 % (ref 11.5–15.5)
WBC: 15.1 10*3/uL — AB (ref 4.0–10.5)

## 2016-07-27 LAB — URINALYSIS, ROUTINE W REFLEX MICROSCOPIC
Bacteria, UA: NONE SEEN
Bilirubin Urine: NEGATIVE
GLUCOSE, UA: NEGATIVE mg/dL
HGB URINE DIPSTICK: NEGATIVE
KETONES UR: NEGATIVE mg/dL
Nitrite: NEGATIVE
PH: 7 (ref 5.0–8.0)
Protein, ur: NEGATIVE mg/dL
Specific Gravity, Urine: 1.002 — ABNORMAL LOW (ref 1.005–1.030)

## 2016-07-27 LAB — CK: Total CK: 90 U/L (ref 38–234)

## 2016-07-27 LAB — MAGNESIUM: Magnesium: 1.9 mg/dL (ref 1.7–2.4)

## 2016-07-27 MED ORDER — MORPHINE SULFATE (PF) 4 MG/ML IV SOLN
4.0000 mg | Freq: Once | INTRAVENOUS | Status: AC
  Administered 2016-07-27: 4 mg via INTRAVENOUS
  Filled 2016-07-27: qty 1

## 2016-07-27 MED ORDER — HYDROMORPHONE HCL 1 MG/ML IJ SOLN: 0.5000 mg | INTRAMUSCULAR | Status: DC | PRN

## 2016-07-27 MED ORDER — ACETAMINOPHEN 650 MG RE SUPP: 650.0000 mg | Freq: Four times a day (QID) | RECTAL | Status: DC | PRN

## 2016-07-27 MED ORDER — KETOROLAC TROMETHAMINE 15 MG/ML IJ SOLN
15.0000 mg | Freq: Four times a day (QID) | INTRAMUSCULAR | Status: DC | PRN
  Administered 2016-07-28 (×2): 15 mg via INTRAVENOUS
  Filled 2016-07-27 (×2): qty 1

## 2016-07-27 MED ORDER — ENOXAPARIN SODIUM 40 MG/0.4ML ~~LOC~~ SOLN
40.0000 mg | SUBCUTANEOUS | Status: DC
Start: 2016-07-27 — End: 2016-07-29
  Administered 2016-07-27 – 2016-07-29 (×3): 40 mg via SUBCUTANEOUS
  Filled 2016-07-27 (×4): qty 0.4

## 2016-07-27 MED ORDER — ONDANSETRON HCL 4 MG/2ML IJ SOLN: 4.0000 mg | Freq: Four times a day (QID) | INTRAMUSCULAR | Status: DC | PRN

## 2016-07-27 MED ORDER — ACETAMINOPHEN 325 MG PO TABS: 650.0000 mg | ORAL_TABLET | Freq: Four times a day (QID) | ORAL | Status: DC | PRN

## 2016-07-27 MED ORDER — ASPIRIN EC 81 MG PO TBEC
81.0000 mg | DELAYED_RELEASE_TABLET | Freq: Every day | ORAL | Status: DC
  Administered 2016-07-27 – 2016-07-29 (×3): 81 mg via ORAL
  Filled 2016-07-27 (×3): qty 1

## 2016-07-27 MED ORDER — EZETIMIBE 10 MG PO TABS
10.0000 mg | ORAL_TABLET | Freq: Every day | ORAL | Status: DC
Start: 2016-07-27 — End: 2016-07-29
  Administered 2016-07-27 – 2016-07-29 (×3): 10 mg via ORAL
  Filled 2016-07-27 (×3): qty 1

## 2016-07-27 MED ORDER — DOCUSATE SODIUM 100 MG PO CAPS
100.0000 mg | ORAL_CAPSULE | Freq: Two times a day (BID) | ORAL | Status: DC
  Filled 2016-07-27: qty 1

## 2016-07-27 MED ORDER — POTASSIUM CHLORIDE CRYS ER 20 MEQ PO TBCR
40.0000 meq | EXTENDED_RELEASE_TABLET | Freq: Once | ORAL | Status: AC
  Administered 2016-07-27: 40 meq via ORAL
  Filled 2016-07-27: qty 2

## 2016-07-27 MED ORDER — LOSARTAN POTASSIUM 50 MG PO TABS
100.0000 mg | ORAL_TABLET | Freq: Every day | ORAL | Status: DC
  Administered 2016-07-27 – 2016-07-29 (×3): 100 mg via ORAL
  Filled 2016-07-27 (×3): qty 2

## 2016-07-27 MED ORDER — SODIUM CHLORIDE 0.9% FLUSH
3.0000 mL | Freq: Two times a day (BID) | INTRAVENOUS | Status: DC
  Administered 2016-07-28 – 2016-07-29 (×2): 3 mL via INTRAVENOUS

## 2016-07-27 MED ORDER — HYDROCODONE-ACETAMINOPHEN 5-325 MG PO TABS
1.0000 | ORAL_TABLET | ORAL | Status: DC | PRN
  Administered 2016-07-27 – 2016-07-29 (×4): 2 via ORAL
  Administered 2016-07-29: 1 via ORAL
  Administered 2016-07-29: 2 via ORAL
  Filled 2016-07-27 (×2): qty 2
  Filled 2016-07-27 (×2): qty 1
  Filled 2016-07-27 (×3): qty 2

## 2016-07-27 MED ORDER — ONDANSETRON HCL 4 MG/2ML IJ SOLN
4.0000 mg | Freq: Once | INTRAMUSCULAR | Status: AC | PRN
  Administered 2016-07-27: 4 mg via INTRAVENOUS
  Filled 2016-07-27: qty 2

## 2016-07-27 MED ORDER — KETOROLAC TROMETHAMINE 30 MG/ML IJ SOLN
15.0000 mg | Freq: Once | INTRAMUSCULAR | Status: AC
  Administered 2016-07-27: 15 mg via INTRAVENOUS
  Filled 2016-07-27: qty 1

## 2016-07-27 MED ORDER — SODIUM CHLORIDE 0.9 % IV SOLN
INTRAVENOUS | Status: AC
  Administered 2016-07-27: 14:00:00 via INTRAVENOUS

## 2016-07-27 NOTE — ED Notes (Signed)
Black, NP at bedside

## 2016-07-27 NOTE — H&P (Signed)
History and Physical    JAVONDA SUH BTD:[id number] DOB: [birth year] DOA: 07/27/2016  PCP: Midge Minium, MD Patient coming from: home  Chief Complaint: fall/left shoulder pain  HPI: Alicia Joseph is a delightful 83 y.o. female with medical history significant for osteopenia, hypertension, hyperlipidemia, memory loss since emergency department with the chief complaint left shoulder pain as result of fall. Initial evaluation reveals fracture left humeral head.  Information is obtained from the patient. She reports getting up in the middle of the night to turn air-conditioner off she did not turn on a light.  she forgot that she had different furniture in the room and tripped. She denies headache dizziness visual disturbances syncope or near-syncope. She denies any loss of consciousness or head injury. She reports she fell onto her left shoulder or left hip and was unable to get up. After a long while she slowly crawled to the bedroom and called her daughter. She was in her usual state of health until this mechanical fall. She denies chest pain palpitation shortness of breath nausea vomiting diarrhea constipation. She denies dysuria hematuria frequency or urgency. She denies any recent travel or sick contacts. She complains of pain in her left shoulder or left hip upon presentation.   ED Course: In the emergency department she's afebrile hemodynamically stable slightly hypertensive with mild tachypnea. She is not hypoxic until receiving a total of 8 mg of morphine. Her oxygen saturation level drops just below 90%. She is given 4 L nasal cannula with good results.   Review of Systems: As per HPI otherwise all other systems reviewed and are negative.   Ambulatory Status: She relates independently with a steady gait. She is independent with ADLs  Past Medical History:  Diagnosis Date  . Chest pain   . FH: colonic polyps   . Hyperlipidemia   . Hypertension   . Menopause   . Osteopenia      Past Surgical History:  Procedure Laterality Date  . ABDOMINAL HYSTERECTOMY    . CHOLECYSTECTOMY      Social History   Social History  . Marital status: Married    Spouse name: N/A  . Number of children: N/A  . Years of education: N/A   Occupational History  . Not on file.   Social History Main Topics  . Smoking status: Never Smoker  . Smokeless tobacco: Never Used  . Alcohol use Yes  . Drug use: No  . Sexual activity: Not on file   Other Topics Concern  . Not on file   Social History Narrative  . No narrative on file    Allergies  Allergen Reactions  . Atorvastatin     REACTION: pain in feet  . Ezetimibe-Simvastatin     REACTION: pain in feet    Family History  Problem Relation Age of Onset  . Cancer Sister        breast  . Cancer Father        lung  . Diabetes Brother   . Cancer Daughter        thyroid    Prior to Admission medications   Medication Sig Start Date End Date Taking? Authorizing Provider  aspirin EC 81 MG tablet Take 81 mg by mouth daily.   Yes [provider]  ezetimibe (ZETIA) 10 MG tablet Take 1 tablet (10 mg total) by mouth daily. 07/17/16  Yes Midge Minium, MD  losartan (COZAAR) 100 MG tablet Take 1 tablet (100 mg total) by mouth  daily. 07/17/16  Yes Midge Minium, MD    Physical Exam: Vitals:   07/27/16 1145 07/27/16 1200 07/27/16 1215 07/27/16 1230  BP: (!) 160/63 128/67 133/79 (!) 162/87  Pulse: 81 73 79 70  Resp: (!) 22 19 18 10   Temp:      TempSrc:      SpO2: 97% 97% 99% 100%     General:  Appears calm and Only slightly uncomfortable left arm sling intact Eyes:  PERRL, EOMI, normal lids, iris ENT:  grossly normal hearing, lips & tongue, mucous membranes are pink very dry Neck:  no LAD, masses or thyromegaly Cardiovascular:  RRR, no m/r/g. No LE edema. Pedal pulses present and palpable Respiratory:  CTA bilaterally, no w/r/r. Normal respiratory effort. Abdomen:  soft, ntnd, positive bowel sounds  throughout Skin:  no rash or induration seen on limited exam Musculoskeletal:  grossly normal tone BUE/BLE, good ROM, no bony abnormality left arm shoulder in immobilizer and sling. Fingers left hand warm. Left radial pulse palpable. Fourth finger right hand with swelling, bruising. Tender to touch. Fair ROM Psychiatric:  grossly normal mood and affect, speech fluent and appropriate, AOx3 Neurologic:  CN 2-12 grossly intact, moves all extremities in coordinated fashion, sensation intact  Labs on Admission: I have personally reviewed following labs and imaging studies  CBC:  Recent Labs Lab 07/27/16 1202  WBC 15.1*  NEUTROABS 13.1*  HGB 11.8*  HCT 35.5*  MCV 91.7  PLT 009   Basic Metabolic Panel:  Recent Labs Lab 07/27/16 1202  NA 134*  K 3.3*  CL 104  CO2 22  GLUCOSE 127*  BUN 9  CREATININE 0.54  CALCIUM 8.2*   GFR: Estimated Creatinine Clearance: 45 mL/min (by C-G formula based on SCr of 0.54 mg/dL). Liver Function Tests: No results for input(s): AST, ALT, ALKPHOS, BILITOT, PROT, ALBUMIN in the last 168 hours. No results for input(s): LIPASE, AMYLASE in the last 168 hours. No results for input(s): AMMONIA in the last 168 hours. Coagulation Profile: No results for input(s): INR, PROTIME in the last 168 hours. Cardiac Enzymes: No results for input(s): CKTOTAL, CKMB, CKMBINDEX, TROPONINI in the last 168 hours. BNP (last 3 results) No results for input(s): PROBNP in the last 8760 hours. HbA1C: No results for input(s): HGBA1C in the last 72 hours. CBG: No results for input(s): GLUCAP in the last 168 hours. Lipid Profile: No results for input(s): CHOL, HDL, LDLCALC, TRIG, CHOLHDL, LDLDIRECT in the last 72 hours. Thyroid Function Tests: No results for input(s): TSH, T4TOTAL, FREET4, T3FREE, THYROIDAB in the last 72 hours. Anemia Panel: No results for input(s): VITAMINB12, FOLATE, FERRITIN, TIBC, IRON, RETICCTPCT in the last 72 hours. Urine analysis: No results found  for: COLORURINE, APPEARANCEUR, LABSPEC, PHURINE, GLUCOSEU, HGBUR, BILIRUBINUR, KETONESUR, PROTEINUR, UROBILINOGEN, NITRITE, LEUKOCYTESUR  Creatinine Clearance: Estimated Creatinine Clearance: 45 mL/min (by C-G formula based on SCr of 0.54 mg/dL).  Sepsis Labs: @LABRCNTIP (procalcitonin:4,lacticidven:4) )No results found for this or any previous visit (from the past 240 hour(s)).   Radiological Exams on Admission: Dg Chest 1 View  Result Date: 07/27/2016 CLINICAL DATA:  Patient fell this AM, left sided pain. Primary pain in left shoulder. EXAM: CHEST 1 VIEW COMPARISON:  Chest x-ray dated 05/13/2016. FINDINGS: Heart size and mediastinal contours are stable. Atherosclerotic changes noted aortic arch. Coarse lung markings bilaterally suggests some degree of chronic interstitial fibrosis. No pleural effusion or pneumothorax seen. Again noted is scoliotic deformity of the thoracolumbar spine, with associated mild degenerative change. No osseous fracture or dislocation seen.  The left humeral neck fracture, appreciated on other plain films from today, is not imaged on this exam. IMPRESSION: No active disease. No evidence of pneumonia or pulmonary edema. Probable chronic interstitial lung disease. Aortic atherosclerosis. Electronically Signed   By: Franki Cabot M.D.   On: 07/27/2016 10:17   Dg Pelvis 1-2 Views  Result Date: 07/27/2016 CLINICAL DATA:  Patient fell this AM, left sided pain. Primary pain in left shoulder. EXAM: PELVIS - 1-2 VIEW COMPARISON:  None. FINDINGS: There is no evidence of pelvic fracture or diastasis. Stable appearance of the sclerotic lesion in the intratrochanteric region of the left femoral neck, compatible with benign bone island or old bone infarct. Mild degenerative change at each hip joint. Irregularity of the left femoral head is likely related to the underlying degenerative change, possibly sequela of a chronic AVN. IMPRESSION: 1. No acute findings.  No osseous fracture or  dislocation. 2. Chronic/degenerative changes at the left hip, suspected chronic AVN at the superomedial margin of the left femoral head. Electronically Signed   By: Franki Cabot M.D.   On: 07/27/2016 10:22   Dg Shoulder Right  Result Date: 07/27/2016 CLINICAL DATA:  Fall, left-sided shoulder pain. EXAM: RIGHT SHOULDER - 2+ VIEW COMPARISON:  None. FINDINGS: Displaced/comminuted fracture within the left humeral neck. There appears to be associated avulsion of the greater tuberosity which is displaced towards the overlying acromion. There is at least mild inferomedial subluxation of the humeral head, likely related to associated joint effusion. IMPRESSION: 1. Displaced/comminuted fracture of the left humeral neck. Avulsed greater tuberosity is superiorly displaced towards the overlying acromion. 2. At least mild inferomedial subluxation of the humeral head, likely related to associated joint effusion. Electronically Signed   By: Franki Cabot M.D.   On: 07/27/2016 10:15   Dg Forearm Left  Result Date: 07/27/2016 CLINICAL DATA:  Patient fell this AM, left sided pain. Primary pain in left shoulder. EXAM: LEFT FOREARM - 2 VIEW COMPARISON:  None. FINDINGS: There is no evidence of fracture or other focal bone lesions. Soft tissues are unremarkable. IMPRESSION: Negative. Electronically Signed   By: Franki Cabot M.D.   On: 07/27/2016 10:18   Dg Humerus Left  Result Date: 07/27/2016 CLINICAL DATA:  Patient fell this AM, left sided pain. Primary pain in left shoulder. EXAM: LEFT HUMERUS - 2+ VIEW COMPARISON:  None. FINDINGS: Displaced/comminuted fracture of the left humeral neck. Mid and distal portions of the left humerus appear intact and normally aligned. IMPRESSION: 1. Displaced/comminuted fracture of the left humeral neck, as described in detail on earlier plain film report of the left shoulder. 2. Mid and distal portions of the left humerus are intact and normally aligned. Electronically Signed   By: Franki Cabot M.D.   On: 07/27/2016 10:19   Dg Femur Min 2 Views Left  Result Date: 07/27/2016 CLINICAL DATA:  Patient fell this AM, left sided pain. Primary pain in left shoulder. EXAM: LEFT FEMUR 2 VIEWS COMPARISON:  None. FINDINGS: There is no evidence of fracture or other focal bone lesions. Soft tissues are unremarkable. Atherosclerosis of the femoral and popliteal artery. IMPRESSION: No acute findings. No osseous fracture or dislocation. Atherosclerosis of the femoral and popliteal arteries. Electronically Signed   By: Franki Cabot M.D.   On: 07/27/2016 10:18    EKG: pending on admission  Assessment/Plan Principal Problem:   Shoulder pain, acute Active Problems:   Hyperlipidemia   Anxiety state   Hypertension   Fall at home   Shoulder fracture   #  1. Acute shoulder pain on left/humeral fracture as a result of a mechanical fall. X-ray Displaced/comminuted fracture of the left humeral neck, as described in detail on earlier plain film report of the left shoulder.  Mid and distal portions of the left humerus are intact and normally aligned. She is afebrile and non-toxic appearing but has leukocytosis. Likely reactive. No signs of infection but urine pending. Orthopedic surgery called by emergency department physician who recommended sling at all times and follow up in one week. Nonsurgical management -Admit -Continue sling to left arm - neurovascular checks -Pain management -PT -check CK -gentle IV fluids -Mobilize  #2. Hypertension. Only fair control in the emergency department. Home medications include losartan. -resume home meds -improved pain control -monitor  #3. Hypokalemia. Mild. Likely related to dehydration -replete -check Mag level -IV fluids -recheck in am    DVT prophylaxis: lovenox  Code Status: full  Family Communication: none present  Disposition Plan: home hopefully tomorrow  Consults called: swinteck ortho  Admission status: obs    Dyanne Carrel M  MD [facility]  If 7PM-7AM, please contact night-coverage [email or web address] Password [facility]  07/27/2016, 12:54 PM

## 2016-07-27 NOTE — ED Provider Notes (Signed)
[facility] DEPT Provider Note   CSN: [id number] Arrival date & time: 07/27/16  1093     History   Chief Complaint Chief Complaint  Patient presents with  . Fall    HPI Alicia Joseph is a 83 y.o. female.  The history is provided by the patient.  Fall  This is a new problem. The current episode started 3 to 5 hours ago. The problem occurs rarely. The problem has not changed since onset.Associated symptoms include chest pain (left chest wall pain). Pertinent negatives include no abdominal pain, no headaches and no shortness of breath. The symptoms are aggravated by twisting, bending and walking. Nothing relieves the symptoms. She has tried nothing for the symptoms.   83 year old female who presents after fall. Tripped while walking to her family room this morning before 4 am to turn off air conditioning. No syncope or near syncope. No head strike or LOC. Fell on left shoulder and left hip. Unable to get up. Crawled to bedroom to call her daugther. c/o left hip/leg pain and left arm/shoulder pain. No HA, n/v, abdominal pain, back pain or neck pain. No recent illness. No blood thinners.  Past Medical History:  Diagnosis Date  . Chest pain   . FH: colonic polyps   . Hyperlipidemia   . Hypertension   . Menopause   . Osteopenia     Patient Active Problem List   Diagnosis Date Noted  . Humeral surgical neck fracture 07/27/2016  . Shoulder pain, acute 07/27/2016  . Memory loss 07/17/2016  . Glenoid fracture of shoulder, left, closed, initial encounter 02/22/2016  . Broken nose 06/23/2013  . Facial laceration 06/23/2013  . Fall at home 06/23/2013  . Benign paroxysmal positional vertigo 01/25/2013  . Muscle spasm 08/27/2012  . Rib pain 12/04/2011  . Hypertension   . General medical examination 03/08/2011  . Hyperglycemia 03/08/2011  . Foot pain 10/23/2010  . UNSPECIFIED SLEEP APNEA 11/13/2009  . Anxiety state 11/10/2009  . HYPOXEMIA 11/10/2009  . BACK PAIN 02/15/2008  .  Hyperlipidemia 01/14/2007  . MENOPAUSE, SURGICAL 01/14/2007  . Osteopenia 01/14/2007    Past Surgical History:  Procedure Laterality Date  . ABDOMINAL HYSTERECTOMY    . CHOLECYSTECTOMY      OB History    No data available       Home Medications    Prior to Admission medications   Medication Sig Start Date End Date Taking? Authorizing Provider  aspirin EC 81 MG tablet Take 81 mg by mouth daily.   Yes [provider]  ezetimibe (ZETIA) 10 MG tablet Take 1 tablet (10 mg total) by mouth daily. 07/17/16  Yes Midge Minium, MD  losartan (COZAAR) 100 MG tablet Take 1 tablet (100 mg total) by mouth daily. 07/17/16  Yes Midge Minium, MD  HYDROcodone-acetaminophen (NORCO/VICODIN) 5-325 MG tablet Take 1 tablet by mouth every 6 (six) hours as needed. Patient not taking: Reported on 07/17/2016 02/22/16   Midge Minium, MD  ipratropium (ATROVENT) 0.06 % nasal spray Place 2 sprays into both nostrils 4 (four) times daily. Patient not taking: Reported on 07/17/2016 05/13/16   Billy Fischer, MD    Family History Family History  Problem Relation Age of Onset  . Cancer Sister        breast  . Cancer Father        lung  . Diabetes Brother   . Cancer Daughter        thyroid    Social  History Social History  Substance Use Topics  . Smoking status: Never Smoker  . Smokeless tobacco: Never Used  . Alcohol use Yes     Allergies   Atorvastatin and Ezetimibe-simvastatin   Review of Systems Review of Systems  Constitutional: Negative for fever.  Eyes: Negative for visual disturbance.  Respiratory: Negative for shortness of breath.   Cardiovascular: Positive for chest pain (left chest wall pain).  Gastrointestinal: Negative for abdominal pain.  Musculoskeletal: Negative for back pain and neck pain.  Skin: Negative for wound.  Neurological: Negative for headaches.  Hematological: Does not bruise/bleed easily.  Psychiatric/Behavioral: Negative for confusion.  All  other systems reviewed and are negative.    Physical Exam Updated Vital Signs BP (!) 160/63   Pulse 81   Temp 97.9 F (36.6 C) (Oral)   Resp (!) 22   SpO2 97%   Physical Exam Physical Exam  Nursing note and vitals reviewed. Constitutional: Well developed, well nourished, non-toxic, and in no acute distress Head: Normocephalic and atraumatic.  Mouth/Throat: Oropharynx is clear and moist.  Neck: Normal range of motion. Neck supple. no cervical spine tenderness Cardiovascular: Normal rate and regular rhythm.   Pulmonary/Chest: Effort normal and breath sounds normal. mild left chest wall tenderness without crepitus or bruising Abdominal: Soft. There is no tenderness. There is no rebound and no guarding.  Musculoskeletal: bruising over left upper arm. Unable to ROM left arm and left leg due to pain Neurological: Alert, no facial droop, fluent speech, moves all extremities symmetrically Skin: Skin is warm and dry.  Psychiatric: Cooperative   ED Treatments / Results  Labs (all labs ordered are listed, but only abnormal results are displayed) Labs Reviewed  CBC WITH DIFFERENTIAL/PLATELET  BASIC METABOLIC PANEL    EKG  EKG Interpretation None       Radiology Dg Chest 1 View  Result Date: 07/27/2016 CLINICAL DATA:  Patient fell this AM, left sided pain. Primary pain in left shoulder. EXAM: CHEST 1 VIEW COMPARISON:  Chest x-ray dated 05/13/2016. FINDINGS: Heart size and mediastinal contours are stable. Atherosclerotic changes noted aortic arch. Coarse lung markings bilaterally suggests some degree of chronic interstitial fibrosis. No pleural effusion or pneumothorax seen. Again noted is scoliotic deformity of the thoracolumbar spine, with associated mild degenerative change. No osseous fracture or dislocation seen. The left humeral neck fracture, appreciated on other plain films from today, is not imaged on this exam. IMPRESSION: No active disease. No evidence of pneumonia or  pulmonary edema. Probable chronic interstitial lung disease. Aortic atherosclerosis. Electronically Signed   By: Franki Cabot M.D.   On: 07/27/2016 10:17   Dg Pelvis 1-2 Views  Result Date: 07/27/2016 CLINICAL DATA:  Patient fell this AM, left sided pain. Primary pain in left shoulder. EXAM: PELVIS - 1-2 VIEW COMPARISON:  None. FINDINGS: There is no evidence of pelvic fracture or diastasis. Stable appearance of the sclerotic lesion in the intratrochanteric region of the left femoral neck, compatible with benign bone island or old bone infarct. Mild degenerative change at each hip joint. Irregularity of the left femoral head is likely related to the underlying degenerative change, possibly sequela of a chronic AVN. IMPRESSION: 1. No acute findings.  No osseous fracture or dislocation. 2. Chronic/degenerative changes at the left hip, suspected chronic AVN at the superomedial margin of the left femoral head. Electronically Signed   By: Franki Cabot M.D.   On: 07/27/2016 10:22   Dg Shoulder Right  Result Date: 07/27/2016 CLINICAL DATA:  Fall, left-sided shoulder  pain. EXAM: RIGHT SHOULDER - 2+ VIEW COMPARISON:  None. FINDINGS: Displaced/comminuted fracture within the left humeral neck. There appears to be associated avulsion of the greater tuberosity which is displaced towards the overlying acromion. There is at least mild inferomedial subluxation of the humeral head, likely related to associated joint effusion. IMPRESSION: 1. Displaced/comminuted fracture of the left humeral neck. Avulsed greater tuberosity is superiorly displaced towards the overlying acromion. 2. At least mild inferomedial subluxation of the humeral head, likely related to associated joint effusion. Electronically Signed   By: Franki Cabot M.D.   On: 07/27/2016 10:15   Dg Forearm Left  Result Date: 07/27/2016 CLINICAL DATA:  Patient fell this AM, left sided pain. Primary pain in left shoulder. EXAM: LEFT FOREARM - 2 VIEW COMPARISON:   None. FINDINGS: There is no evidence of fracture or other focal bone lesions. Soft tissues are unremarkable. IMPRESSION: Negative. Electronically Signed   By: Franki Cabot M.D.   On: 07/27/2016 10:18   Dg Humerus Left  Result Date: 07/27/2016 CLINICAL DATA:  Patient fell this AM, left sided pain. Primary pain in left shoulder. EXAM: LEFT HUMERUS - 2+ VIEW COMPARISON:  None. FINDINGS: Displaced/comminuted fracture of the left humeral neck. Mid and distal portions of the left humerus appear intact and normally aligned. IMPRESSION: 1. Displaced/comminuted fracture of the left humeral neck, as described in detail on earlier plain film report of the left shoulder. 2. Mid and distal portions of the left humerus are intact and normally aligned. Electronically Signed   By: Franki Cabot M.D.   On: 07/27/2016 10:19   Dg Femur Min 2 Views Left  Result Date: 07/27/2016 CLINICAL DATA:  Patient fell this AM, left sided pain. Primary pain in left shoulder. EXAM: LEFT FEMUR 2 VIEWS COMPARISON:  None. FINDINGS: There is no evidence of fracture or other focal bone lesions. Soft tissues are unremarkable. Atherosclerosis of the femoral and popliteal artery. IMPRESSION: No acute findings. No osseous fracture or dislocation. Atherosclerosis of the femoral and popliteal arteries. Electronically Signed   By: Franki Cabot M.D.   On: 07/27/2016 10:18    Procedures Procedures (including critical care time)  Medications Ordered in ED Medications  morphine 4 MG/ML injection 4 mg (4 mg Intravenous Given 07/27/16 0907)  morphine 4 MG/ML injection 4 mg (4 mg Intravenous Given 07/27/16 1010)  ondansetron (ZOFRAN) injection 4 mg (4 mg Intravenous Given 07/27/16 1103)  ketorolac (TORADOL) 30 MG/ML injection 15 mg (15 mg Intravenous Given 07/27/16 1203)     Initial Impression / Assessment and Plan / ED Course  I have reviewed the triage vital signs and the nursing notes.  Pertinent labs & imaging results that were available  during my care of the patient were reviewed by me and considered in my medical decision making (see chart for details).    83 year old female who presents after mechanical fall w/ left hip and left shoulder pain.  No history of head trauma. Neuro in tact. mentation normal.  XR of hip w/o fracture and with pain control, she is able to ROM without difficulty. Doubt occult fracture. XR shoulder shows comminuted and displaced humeral neck fracture with greater tuberosity fragement near acromium.  Shoulder pain difficult to control in ED. With limited ability to get up by self, and given that she lives at home by herself, recommended admission for pain control and PT/PMR. Discussed with Swinteck, who recommended sling at all times and follow-up 1 week. Nonsurgical management.  Discussed with Dyanne Carrel  Final Clinical Impressions(s) / ED Diagnoses   Final diagnoses:  Closed displaced fracture of surgical neck of left humerus, unspecified fracture morphology, initial encounter  Fall, initial encounter    New Prescriptions New Prescriptions   No medications on file     Forde Dandy, MD 07/27/16 1206

## 2016-07-27 NOTE — ED Triage Notes (Signed)
Pt in from home via [facility], per report pt had fall at [place] did was unable to stand for 3 hrs, pt was then able to call for help, pt reported to c/o L shoulder & L arm pain, denies hitting head, denies LOC, reports fall d/t tripping not r/t weakness or syncope, A&O x4, hypertensive in route

## 2016-07-28 DIAGNOSIS — R269 Unspecified abnormalities of gait and mobility: Secondary | ICD-10-CM | POA: Diagnosis not present

## 2016-07-28 DIAGNOSIS — S42212D Unspecified displaced fracture of surgical neck of left humerus, subsequent encounter for fracture with routine healing: Secondary | ICD-10-CM | POA: Diagnosis not present

## 2016-07-28 DIAGNOSIS — W19XXXA Unspecified fall, initial encounter: Secondary | ICD-10-CM

## 2016-07-28 DIAGNOSIS — S42212A Unspecified displaced fracture of surgical neck of left humerus, initial encounter for closed fracture: Secondary | ICD-10-CM

## 2016-07-28 DIAGNOSIS — W19XXXD Unspecified fall, subsequent encounter: Secondary | ICD-10-CM | POA: Diagnosis not present

## 2016-07-28 DIAGNOSIS — E785 Hyperlipidemia, unspecified: Secondary | ICD-10-CM

## 2016-07-28 DIAGNOSIS — I10 Essential (primary) hypertension: Secondary | ICD-10-CM | POA: Diagnosis not present

## 2016-07-28 LAB — CK: Total CK: 112 U/L (ref 38–234)

## 2016-07-28 LAB — CBC
HEMATOCRIT: 35 % — AB (ref 36.0–46.0)
HEMOGLOBIN: 11.6 g/dL — AB (ref 12.0–15.0)
MCH: 31 pg (ref 26.0–34.0)
MCHC: 33.1 g/dL (ref 30.0–36.0)
MCV: 93.6 fL (ref 78.0–100.0)
Platelets: 228 10*3/uL (ref 150–400)
RBC: 3.74 MIL/uL — ABNORMAL LOW (ref 3.87–5.11)
RDW: 13.4 % (ref 11.5–15.5)
WBC: 9.4 10*3/uL (ref 4.0–10.5)

## 2016-07-28 LAB — BASIC METABOLIC PANEL
Anion gap: 6 (ref 5–15)
BUN: 8 mg/dL (ref 6–20)
CO2: 24 mmol/L (ref 22–32)
Calcium: 8.4 mg/dL — ABNORMAL LOW (ref 8.9–10.3)
Chloride: 105 mmol/L (ref 101–111)
Creatinine, Ser: 0.6 mg/dL (ref 0.44–1.00)
GFR calc Af Amer: >60 mL/min (ref >60)
GLUCOSE: 116 mg/dL — AB (ref 65–99)
POTASSIUM: 4.5 mmol/L (ref 3.5–5.1)
Sodium: 135 mmol/L (ref 135–145)

## 2016-07-28 MED ORDER — SENNOSIDES-DOCUSATE SODIUM 8.6-50 MG PO TABS
1.0000 | ORAL_TABLET | Freq: Two times a day (BID) | ORAL | Status: DC
  Administered 2016-07-28 – 2016-07-29 (×3): 1 via ORAL
  Filled 2016-07-28 (×3): qty 1

## 2016-07-28 MED ORDER — ACETAMINOPHEN 500 MG PO TABS
1000.0000 mg | ORAL_TABLET | Freq: Three times a day (TID) | ORAL | Status: DC
  Administered 2016-07-28 – 2016-07-29 (×4): 1000 mg via ORAL
  Filled 2016-07-28 (×4): qty 2

## 2016-07-28 MED ORDER — SENNOSIDES-DOCUSATE SODIUM 8.6-50 MG PO TABS: 1.0000 | ORAL_TABLET | Freq: Two times a day (BID) | ORAL | 0 refills | Status: AC

## 2016-07-28 MED ORDER — HYDRALAZINE HCL 20 MG/ML IJ SOLN: 10.0000 mg | Freq: Four times a day (QID) | INTRAMUSCULAR | Status: DC | PRN

## 2016-07-28 MED ORDER — HYDROCODONE-ACETAMINOPHEN 5-325 MG PO TABS: 1.0000 | ORAL_TABLET | ORAL | 0 refills | Status: DC | PRN

## 2016-07-28 MED ORDER — ZOLPIDEM TARTRATE 5 MG PO TABS
5.0000 mg | ORAL_TABLET | Freq: Once | ORAL | Status: AC
  Administered 2016-07-28: 5 mg via ORAL
  Filled 2016-07-28: qty 1

## 2016-07-28 MED ORDER — ACETAMINOPHEN 325 MG PO TABS: 650.0000 mg | ORAL_TABLET | Freq: Four times a day (QID) | ORAL | 0 refills | Status: AC | PRN

## 2016-07-28 MED ORDER — POLYETHYLENE GLYCOL 3350 17 G PO PACK
17.0000 g | PACK | Freq: Every day | ORAL | Status: DC | PRN
  Administered 2016-07-28: 17 g via ORAL
  Filled 2016-07-28: qty 1

## 2016-07-28 MED ORDER — POLYETHYLENE GLYCOL 3350 17 G PO PACK: 17.0000 g | PACK | Freq: Every day | ORAL | 0 refills | Status: AC | PRN

## 2016-07-28 NOTE — Progress Notes (Addendum)
[facility]                                                                              Patient Demographics  Alicia Joseph, is a 83 y.o. female, DOB - 01-Jun-1929, DVV:[id number]  Admit date - 07/27/2016   Admitting Physician Elwin Mocha, MD  Outpatient Primary MD for the patient is Midge Minium, MD  Outpatient specialists:   LOS - 0  days    Chief Complaint  Patient presents with  . Fall       Brief summary   Patient is a 83 year old female with osteopenia, hypertension, hyperlipidemia presented with left shoulder fracture after mechanical fall. Patient got up in the middle of the night returns in conditioner off and she did not turn on the light and tripped over her furniture. X-ray showed displaced/comminuted fracture of the left humeral neck. Orthopedics/Dr. Lyla Glassing was consulted by EDP who recommended nonoperative management, sling at all times and follow-up in one week.  Assessment & Plan    Principal Problem:   Humeral surgical neck fracture Secondary to mechanical fall - X-ray showed displaced/comminuted fracture of the left humeral neck - Orthopedic surgery, Dr. Lyla Glassing was consulted by EDP who recommended sling at all times and nonoperative management, follow-up in one week - Continue sling to left arm, pain not well controlled, placed on scheduled Tylenol and continue Percocet, Dilaudid for severe pain - PTOT evaluation - Difficult disposition as patient lives alone, currently max assist, bedrooms and bathrooms all upstairs, daughter not available 24 hours. I have also placed CIR consult  Active Problems:   Hyperlipidemia - Continue Zetia    Hypertension - BP likely elevated due to pain, continue losartan, added hydralazine with parameters     Fall at home - PTOT evaluation recommending short-term rehabilitation    Hypokalemia - Currently resolved   Code Status: Full CODE STATUS DVT Prophylaxis:  Lovenox  Family  Communication: Discussed in detail with the patient, all imaging results, lab results explained to the patient. Called patient's daughter, however she did not pick up the phone  Disposition Plan: unclear   Time Spent in minutes  25 minutes  Procedures:  None   Consultants:   Ortho   Antimicrobials:   None    Medications  Scheduled Meds: . acetaminophen  1,000 mg Oral Q8H  . aspirin EC  81 mg Oral Daily  . enoxaparin (LOVENOX) injection  40 mg Subcutaneous Q24H  . ezetimibe  10 mg Oral Daily  . losartan  100 mg Oral Daily  . senna-docusate  1 tablet Oral BID  . sodium chloride flush  3 mL Intravenous Q12H   Continuous Infusions: PRN Meds:.acetaminophen **OR** acetaminophen, HYDROcodone-acetaminophen, HYDROmorphone (DILAUDID) injection, ketorolac, ondansetron (ZOFRAN) IV, polyethylene glycol   Antibiotics   Anti-infectives    None        Subjective:   [name] was seen and examined today.  Pain not well controlled in the left shoulder, still 7-8/10. No nausea or vomiting, chest pain or shortness of breath. No fevers or chills. Patient denies dizziness, abdominal pain,  new weakness, numbess, tingling. No acute events overnight.  Objective:   Vitals:   07/27/16 1230 07/27/16 1245 07/27/16 2039 07/28/16 0455  BP: (!) 162/87 (!) 156/85 (!) 192/77 (!) 147/62  Pulse: 70 71 74 79  Resp: 10 15 18 16   Temp:   98.4 F (36.9 C) 98.3 F (36.8 C)  TempSrc:   Oral Oral  SpO2: 100% 97% 99% 95%   No intake or output data in the 24 hours ending 07/28/16 1139   Wt Readings from Last 3 Encounters:  07/17/16 61 kg (134 lb 8 oz)  02/22/16 60.5 kg (133 lb 6 oz)  02/08/16 62.1 kg (137 lb)     Exam  General: Alert and oriented x 3, NAD, And comfortable  HEENT:  PERRLA, EOMI, Anicteric Sclera, mucous membranes moist.   Neck: Supple, no JVD, no masses  Cardiovascular: S1 S2 auscultated, no rubs, murmurs or gallops. Regular rate and rhythm.  Respiratory: Clear to  auscultation bilaterally, no wheezing, rales or rhonchi  Gastrointestinal: Soft, nontender, nondistended, + bowel sounds  Ext: no cyanosis clubbing or edema, left shoulder in sling  Neuro: AAOx3, Cr N's II- XII. Strength 5/5 upper and lower extremities bilaterally  Skin: No rashes  Psych: Normal affect and demeanor, alert and oriented x3    Data Reviewed:  I have personally reviewed following labs and imaging studies  Micro Results No results found for this or any previous visit (from the past 240 hour(s)).  Radiology Reports Dg Chest 1 View  Result Date: 07/27/2016 CLINICAL DATA:  Patient fell this AM, left sided pain. Primary pain in left shoulder. EXAM: CHEST 1 VIEW COMPARISON:  Chest x-ray dated 05/13/2016. FINDINGS: Heart size and mediastinal contours are stable. Atherosclerotic changes noted aortic arch. Coarse lung markings bilaterally suggests some degree of chronic interstitial fibrosis. No pleural effusion or pneumothorax seen. Again noted is scoliotic deformity of the thoracolumbar spine, with associated mild degenerative change. No osseous fracture or dislocation seen. The left humeral neck fracture, appreciated on other plain films from today, is not imaged on this exam. IMPRESSION: No active disease. No evidence of pneumonia or pulmonary edema. Probable chronic interstitial lung disease. Aortic atherosclerosis. Electronically Signed   By: Franki Cabot M.D.   On: 07/27/2016 10:17   Dg Pelvis 1-2 Views  Result Date: 07/27/2016 CLINICAL DATA:  Patient fell this AM, left sided pain. Primary pain in left shoulder. EXAM: PELVIS - 1-2 VIEW COMPARISON:  None. FINDINGS: There is no evidence of pelvic fracture or diastasis. Stable appearance of the sclerotic lesion in the intratrochanteric region of the left femoral neck, compatible with benign bone island or old bone infarct. Mild degenerative change at each hip joint. Irregularity of the left femoral head is likely related to the  underlying degenerative change, possibly sequela of a chronic AVN. IMPRESSION: 1. No acute findings.  No osseous fracture or dislocation. 2. Chronic/degenerative changes at the left hip, suspected chronic AVN at the superomedial margin of the left femoral head. Electronically Signed   By: Franki Cabot M.D.   On: 07/27/2016 10:22   Dg Shoulder Right  Result Date: 07/27/2016 CLINICAL DATA:  Fall, left-sided shoulder pain. EXAM: RIGHT SHOULDER - 2+ VIEW COMPARISON:  None. FINDINGS: Displaced/comminuted fracture within the left humeral neck. There appears to be associated avulsion of the greater tuberosity which is displaced towards the overlying acromion. There is at least mild inferomedial subluxation of the humeral head, likely related to associated joint effusion. IMPRESSION: 1. Displaced/comminuted fracture of the left humeral neck. Avulsed greater tuberosity is superiorly displaced towards  the overlying acromion. 2. At least mild inferomedial subluxation of the humeral head, likely related to associated joint effusion. Electronically Signed   By: Franki Cabot M.D.   On: 07/27/2016 10:15   Dg Forearm Left  Result Date: 07/27/2016 CLINICAL DATA:  Patient fell this AM, left sided pain. Primary pain in left shoulder. EXAM: LEFT FOREARM - 2 VIEW COMPARISON:  None. FINDINGS: There is no evidence of fracture or other focal bone lesions. Soft tissues are unremarkable. IMPRESSION: Negative. Electronically Signed   By: Franki Cabot M.D.   On: 07/27/2016 10:18   Dg Humerus Left  Result Date: 07/27/2016 CLINICAL DATA:  Patient fell this AM, left sided pain. Primary pain in left shoulder. EXAM: LEFT HUMERUS - 2+ VIEW COMPARISON:  None. FINDINGS: Displaced/comminuted fracture of the left humeral neck. Mid and distal portions of the left humerus appear intact and normally aligned. IMPRESSION: 1. Displaced/comminuted fracture of the left humeral neck, as described in detail on earlier plain film report of the left  shoulder. 2. Mid and distal portions of the left humerus are intact and normally aligned. Electronically Signed   By: Franki Cabot M.D.   On: 07/27/2016 10:19   Dg Femur Min 2 Views Left  Result Date: 07/27/2016 CLINICAL DATA:  Patient fell this AM, left sided pain. Primary pain in left shoulder. EXAM: LEFT FEMUR 2 VIEWS COMPARISON:  None. FINDINGS: There is no evidence of fracture or other focal bone lesions. Soft tissues are unremarkable. Atherosclerosis of the femoral and popliteal artery. IMPRESSION: No acute findings. No osseous fracture or dislocation. Atherosclerosis of the femoral and popliteal arteries. Electronically Signed   By: Franki Cabot M.D.   On: 07/27/2016 10:18    Lab Data:  CBC:  Recent Labs Lab 07/27/16 1202 07/28/16 0453  WBC 15.1* 9.4  NEUTROABS 13.1*  --   HGB 11.8* 11.6*  HCT 35.5* 35.0*  MCV 91.7 93.6  PLT 246 366   Basic Metabolic Panel:  Recent Labs Lab 07/27/16 1202 07/27/16 1315 07/28/16 0453  NA 134*  --  135  K 3.3*  --  4.5  CL 104  --  105  CO2 22  --  24  GLUCOSE 127*  --  116*  BUN 9  --  8  CREATININE 0.54  --  0.60  CALCIUM 8.2*  --  8.4*  MG  --  1.9  --    GFR: Estimated Creatinine Clearance: 45 mL/min (by C-G formula based on SCr of 0.6 mg/dL). Liver Function Tests: No results for input(s): AST, ALT, ALKPHOS, BILITOT, PROT, ALBUMIN in the last 168 hours. No results for input(s): LIPASE, AMYLASE in the last 168 hours. No results for input(s): AMMONIA in the last 168 hours. Coagulation Profile: No results for input(s): INR, PROTIME in the last 168 hours. Cardiac Enzymes:  Recent Labs Lab 07/27/16 1315 07/28/16 0453  CKTOTAL 90 112   BNP (last 3 results) No results for input(s): PROBNP in the last 8760 hours. HbA1C: No results for input(s): HGBA1C in the last 72 hours. CBG: No results for input(s): GLUCAP in the last 168 hours. Lipid Profile: No results for input(s): CHOL, HDL, LDLCALC, TRIG, CHOLHDL, LDLDIRECT in  the last 72 hours. Thyroid Function Tests: No results for input(s): TSH, T4TOTAL, FREET4, T3FREE, THYROIDAB in the last 72 hours. Anemia Panel: No results for input(s): VITAMINB12, FOLATE, FERRITIN, TIBC, IRON, RETICCTPCT in the last 72 hours. Urine analysis:    Component Value Date/Time   COLORURINE COLORLESS (A) 07/27/2016 2250  APPEARANCEUR CLEAR 07/27/2016 2250   LABSPEC 1.002 (L) 07/27/2016 2250   PHURINE 7.0 07/27/2016 2250   GLUCOSEU NEGATIVE 07/27/2016 2250   HGBUR NEGATIVE 07/27/2016 2250   BILIRUBINUR NEGATIVE 07/27/2016 2250   KETONESUR NEGATIVE 07/27/2016 2250   PROTEINUR NEGATIVE 07/27/2016 2250   NITRITE NEGATIVE 07/27/2016 2250   LEUKOCYTESUR SMALL (A) 07/27/2016 2250     Tulani Kidney M.D. [facility] 07/28/2016, 11:39 AM  Pager: [phone] Between 7am to 7pm - call Pager - [phone]  After 7pm go to [email or web address] - password [email or web address]  Call night coverage person covering after 7pm

## 2016-07-28 NOTE — Evaluation (Signed)
Occupational Therapy Evaluation Patient Details Name: Alicia Joseph MRN: [id number] DOB: 07-19-1929 Today's Date: 07/28/2016    History of Present Illness Pt is a 83 yo female admitted through ED following a fall on 07/27/16 resulting in a left humeral head fracture. The fracture is non-operative at this time. PMH significant for osteopenia, HTN, HLD, memory loss.    Clinical Impression   PTA, pt was independent and living alone. Currently, pt requires Max A for dressing and bathing due to significant pain with movement. Pt is Min A for transfers and single hand held A for functional mobility. Pt reports that she would like to dc home; discussed need for 24 hour supervision due to decreased functional performance and safety. Pt would benefit from short term rehab to increase her independence and safety with ADLs. However, if pt progresses well tomorrow and can arrange help at home, then may be able to dc home with [place]. Will follow acutely to increase pt safety and independence.     Follow Up Recommendations  Home health OT;Supervision/Assistance - 24 hour;SNF (HHOT vs SNF pending pt's progress)    Equipment Recommendations  3 in 1 bedside commode    Recommendations for Other Services       Precautions / Restrictions Precautions Precautions: Shoulder Shoulder Interventions: Shoulder sling/immobilizer;At all times Restrictions Weight Bearing Restrictions: Yes LUE Weight Bearing: Non weight bearing      Mobility Bed Mobility Overal bed mobility: Needs Assistance Bed Mobility: Supine to Sit     Supine to sit: Min assist     General bed mobility comments: Min A to bring trunk upright at EOB due to pain  Transfers Overall transfer level: Needs assistance Equipment used: 1 person hand held assist Transfers: Sit to/from [facility] Sit to Stand: Min assist Stand pivot transfers: Min assist       General transfer comment: Min A to stand from EOB and to pivot  to recliner with 1 hand held assist.     Balance Overall balance assessment: History of Falls;Needs assistance Sitting-balance support: Single extremity supported;Feet supported Sitting balance-Leahy Scale: Fair   Postural control: Right lateral lean Standing balance support: Single extremity supported;During functional activity Standing balance-Leahy Scale: Fair Standing balance comment: requires 1 hand held assist to maintain balance in standing                           ADL either performed or assessed with clinical judgement   ADL Overall ADL's : Needs assistance/impaired Eating/Feeding: Set up;Sitting   Grooming: Set up;Standing;Min guard Grooming Details (indicate cue type and reason): Unable to stand for long periods of time due decreased activity tolerance with pain Upper Body Bathing: Maximal assistance;Sitting   Lower Body Bathing: Moderate assistance;Sit to/from stand   Upper Body Dressing : Maximal assistance;Sitting Upper Body Dressing Details (indicate cue type and reason): Educated pt on sling positioning and donning/doffing. Pt limited by pain. Will need to educate on UB dressing at next session prior to dc Lower Body Dressing: Moderate assistance;Sit to/from stand Lower Body Dressing Details (indicate cue type and reason): Pt able to bring ankle to knee but has been ROM leaning forward due to pain Toilet Transfer: Minimal assistance;Ambulation;Grab bars (single hand held) Toilet Transfer Details (indicate cue type and reason): Pt performed transfer to [facility] with Min A.  Toileting- Clothing Manipulation and Hygiene: Minimal assistance;Sit to/from stand Toileting - Clothing Manipulation Details (indicate cue type and reason): Pt performed toilet hygiene  Tub/Shower Transfer Details (indicate cue type and reason): Will need education on safe shower tf Functional mobility during ADLs: Minimal assistance (Single hand held A) General ADL Comments: Pt has  limited functional performance mainly due to pain with movement. Pt wants to go home instead instead of SNF. however, due to limitations and decreased caregiver supportshe may need short SNF stay     Vision         Perception     Praxis      Pertinent Vitals/Pain Pain Assessment: 0-10 Pain Score: 8  Pain Location: left shoulder  Pain Descriptors / Indicators: Aching;Sore;Sharp;Shooting Pain Intervention(s): Monitored during session;RN gave pain meds during session;Patient requesting pain meds-RN notified;Repositioned;Ice applied     Hand Dominance Right   Extremity/Trunk Assessment Upper Extremity Assessment Upper Extremity Assessment: Defer to OT evaluation LUE Deficits / Details: L shoulder with fx. No ROM. Pt with limited elbow, wrist, and grasp ROM due to significant pain.  LUE: Unable to fully assess due to immobilization;Unable to fully assess due to pain LUE Coordination: decreased fine motor;decreased gross motor   Lower Extremity Assessment Lower Extremity Assessment: Overall WFL for tasks assessed   Cervical / Trunk Assessment Cervical / Trunk Assessment: Normal   Communication Communication Communication: No difficulties   Cognition Arousal/Alertness: Awake/alert Behavior During Therapy: WFL for tasks assessed/performed Overall Cognitive Status: Within Functional Limits for tasks assessed                                     General Comments  Pt's daughter lives close by but is not available 24hrs a day. All bedrooms and full bathrooms are upstairs in home.     Exercises Exercises: Shoulder   Shoulder Instructions Shoulder Instructions Donning/doffing sling/immobilizer: Maximal assistance Correct positioning of sling/immobilizer: Maximal assistance    Home Living Family/patient expects to be discharged to:: Private residence Living Arrangements: Alone Available Help at Discharge: Family;Available PRN/intermittently Type of Home:  House Home Access: Stairs to enter [employer] of Steps: 2-3   Home Layout: Two level;Bed/bath upstairs;1/2 bath on main level Alternate Level Stairs-Number of Steps: flight  Alternate Level Stairs-Rails: Left Bathroom Shower/Tub: Occupational psychologist: Standard Bathroom Accessibility: Yes   Home Equipment: Environmental consultant - 2 wheels;Cane - single point;Hand held shower head          Prior Functioning/Environment Level of Independence: Independent        Comments: was completely independent and driving.         OT Problem List: Decreased strength;Decreased range of motion;Decreased activity tolerance;Impaired balance (sitting and/or standing);Decreased safety awareness;Decreased knowledge of use of DME or AE;Decreased knowledge of precautions;Pain;Impaired UE functional use      OT Treatment/Interventions: Self-care/ADL training;Therapeutic exercise;Energy conservation;DME and/or AE instruction;Therapeutic activities;Patient/family education    OT Goals(Current goals can be found in the care plan section) Acute Rehab OT Goals Patient Stated Goal: to get back home and have less pain OT Goal Formulation: With patient Time For Goal Achievement: 08/11/16 Potential to Achieve Goals: Good ADL Goals Pt Will Perform Grooming: with set-up;with supervision;standing Pt Will Perform Upper Body Dressing: with min assist;with caregiver independent in assisting;sitting Pt Will Perform Lower Body Dressing: with min assist;with caregiver independent in assisting;with adaptive equipment;sit to/from stand Pt Will Transfer to Toilet: with min guard assist;bedside commode;ambulating Pt Will Perform Toileting - Clothing Manipulation and hygiene: with min guard assist;sit to/from stand Pt Will Perform Tub/Shower Transfer: Multimedia programmer  transfer;3 in 1;ambulating;with min guard assist  OT Frequency: Min 2X/week   Barriers to D/C: Decreased caregiver support  Pt lives alone. Discussed  with pt need for initial 24 hr support.       Co-evaluation              AM-PAC PT "6 Clicks" Daily Activity     Outcome Measure Help from another person eating meals?: None Help from another person taking care of personal grooming?: A Little Help from another person toileting, which includes using toliet, bedpan, or urinal?: A Little Help from another person bathing (including washing, rinsing, drying)?: A Lot Help from another person to put on and taking off regular upper body clothing?: A Lot Help from another person to put on and taking off regular lower body clothing?: A Lot 6 Click Score: 16   End of Session Equipment Utilized During Treatment: Other (comment) (Sling) Nurse Communication: Mobility status;Precautions  Activity Tolerance: Patient limited by pain Patient left: in chair;with call bell/phone within reach  OT Visit Diagnosis: Unsteadiness on feet (R26.81);Other abnormalities of gait and mobility (R26.89);Muscle weakness (generalized) (M62.81);Pain Pain - Right/Left: Left Pain - part of body: Shoulder                Time: 3291-9166 OT Time Calculation (min): 32 min Charges:  OT General Charges $OT Visit: 1 Procedure OT Evaluation $OT Eval Low Complexity: 1 Procedure OT Treatments $Self Care/Home Management : 8-22 mins G-Codes:     [employer], OTR/L [place] 07/28/2016, 10:45 AM

## 2016-07-28 NOTE — Care Management Note (Signed)
Case Management Note  Patient Details  Name: CHARMEKA FREEBURG MRN: [id number] Date of Birth: 1929-05-05  Subjective/Objective:                  fall Action/Plan: Discharge  Expected Discharge Date:  07/28/16               Expected Discharge Plan:  [place]  In-House Referral:     Discharge planning Services  CM Consult  Post Acute Care Choice:  Home Health Choice offered to:  Patient  DME Arranged:  3-N-1, Walker rolling with seat DME Agency:  [place]:  RN, PT, OT, Nurse's Aide [place] Agency:  Other - See comment  Status of Service:  Completed, signed off  If discussed at [place] of Stay Meetings, dates discussed:    Additional Comments: CM spoke with pt for choice of home health agency; choices are limited bc of insurance; pt chooses [facility].  Referral called to [facility] Runner for hHPT/OT/Rn/Aide.  Pt has no DME at home and CM notified [facility] DME rep, reggie to please deliver the rollator and 3n1 to room so pt can discharge.  NO other CM needs were communicated. Dellie Catholic, RN 07/28/2016, 9:27 AM

## 2016-07-28 NOTE — Evaluation (Signed)
Physical Therapy Evaluation Patient Details Name: Alicia Joseph MRN: [id number] DOB: 1930-02-23 Today's Date: 07/28/2016   History of Present Illness  Pt is a 83 yo female admitted through ED following a fall on 07/27/16 resulting in a left humeral head fracture. The fracture is non-operative at this time. PMH significant for osteopenia, HTN, HLD, memory loss.   Clinical Impression  Pt presents with the above diagnosis and below deficits for therapy evaluation. Prior to admission, pt was living alone and completely independent including doing her own housework and driving herself. Pt lives in a 2-story home with bedrooms and full bathrooms upstairs. Pt does have a half-bath downstairs, but no bedrooms or recliner. Pt requires Min A for all mobility this session and was unable to perform any gait due to pain in LUE. Pt would like to return home where he daughter is not available 24hrs a day. Pt is expected to improve, but for safety, may benefit from short term rehab in order to maximize her outcomes prior to DC home. If pt is able to perform stair negotiation and gait with supervision, and line up consistent help at home, she may be okay to return home with [facility] services. PT to follow acutely in order to address the below deficits prior to DC to proper venue.     Follow Up Recommendations SNF VS Home health PT with Supervision/Assistance - 24 hour    Equipment Recommendations  3in1 (PT)    Recommendations for Other Services OT consult     Precautions / Restrictions Precautions Precautions: Shoulder Shoulder Interventions: Shoulder sling/immobilizer;At all times Restrictions Weight Bearing Restrictions: Yes LUE Weight Bearing: Non weight bearing      Mobility  Bed Mobility Overal bed mobility: Needs Assistance Bed Mobility: Supine to Sit     Supine to sit: Min assist     General bed mobility comments: Min A to bring trunk upright at EOB due to pain  Transfers Overall transfer  level: Needs assistance Equipment used: 1 person hand held assist Transfers: Sit to/from [facility] Sit to Stand: Min assist Stand pivot transfers: Min assist       General transfer comment: Min A to stand from EOB and to pivot to recliner with 1 hand held assist.   Ambulation/Gait             General Gait Details: did not attempt this session due to pain  Stairs            Wheelchair Mobility    Modified Rankin (Stroke Patients Only)       Balance Overall balance assessment: History of Falls;Needs assistance Sitting-balance support: Single extremity supported;Feet supported Sitting balance-Leahy Scale: Fair   Postural control: Right lateral lean Standing balance support: Single extremity supported;During functional activity Standing balance-Leahy Scale: Fair Standing balance comment: requires 1 hand held assist to maintain balance in standing                             Pertinent Vitals/Pain Pain Assessment: 0-10 Pain Score: 8  Pain Location: left shoulder  Pain Descriptors / Indicators: Aching;Sore;Sharp;Shooting Pain Intervention(s): Monitored during session;RN gave pain meds during session;Patient requesting pain meds-RN notified;Repositioned;Ice applied    Home Living Family/patient expects to be discharged to:: Private residence Living Arrangements: Alone Available Help at Discharge: Family;Available PRN/intermittently Type of Home: House Home Access: Stairs to enter   Entrance Stairs-Number of Steps: 2-3 Home Layout: Two level;Bed/bath upstairs;1/2 bath on main  level Home Equipment: Walker - 2 wheels;Cane - single point;Hand held shower head      Prior Function Level of Independence: Independent         Comments: was completely independent and driving.      Hand Dominance   Dominant Hand: Right    Extremity/Trunk Assessment   Upper Extremity Assessment Upper Extremity Assessment: Defer to OT  evaluation    Lower Extremity Assessment Lower Extremity Assessment: Overall WFL for tasks assessed    Cervical / Trunk Assessment Cervical / Trunk Assessment: Normal  Communication   Communication: No difficulties  Cognition Arousal/Alertness: Awake/alert Behavior During Therapy: WFL for tasks assessed/performed Overall Cognitive Status: Within Functional Limits for tasks assessed                                        General Comments General comments (skin integrity, edema, etc.): Pt's daughter lives close by but is not available 24hrs a day. All bedrooms and full bathrooms are upstairs in home.     Exercises     Assessment/Plan    PT Assessment Patient needs continued PT services  PT Problem List Decreased strength;Decreased range of motion;Decreased activity tolerance;Decreased balance;Decreased mobility;Pain       PT Treatment Interventions DME instruction;Gait training;Stair training;Functional mobility training;Therapeutic activities;Therapeutic exercise;Balance training;Patient/family education    PT Goals (Current goals can be found in the Care Plan section)  Acute Rehab PT Goals Patient Stated Goal: to get back home and have less pain PT Goal Formulation: With patient Time For Goal Achievement: 08/04/16 Potential to Achieve Goals: Good    Frequency Min 3X/week   Barriers to discharge Decreased caregiver support daugher is not available    Co-evaluation               AM-PAC PT "6 Clicks" Daily Activity  Outcome Measure Difficulty turning over in bed (including adjusting bedclothes, sheets and blankets)?: Total Difficulty moving from lying on back to sitting on the side of the bed? : Total Difficulty sitting down on and standing up from a chair with arms (e.g., wheelchair, bedside commode, etc,.)?: A Lot Help needed moving to and from a bed to chair (including a wheelchair)?: A Lot Help needed walking in hospital room?: A Lot Help  needed climbing 3-5 steps with a railing? : Total 6 Click Score: 9    End of Session Equipment Utilized During Treatment: Gait belt;Other (comment) (Left shoulder sling) Activity Tolerance: Patient tolerated treatment well;Patient limited by pain Patient left: in chair;with call bell/phone within reach Nurse Communication: Mobility status PT Visit Diagnosis: Unsteadiness on feet (R26.81);Difficulty in walking, not elsewhere classified (R26.2)    Time: 8882-8003 PT Time Calculation (min) (ACUTE ONLY): 52 min   Charges:   PT Evaluation $PT Eval Moderate Complexity: 1 Procedure PT Treatments $Therapeutic Activity: 23-37 mins   PT G Codes:   PT G-Codes **NOT FOR INPATIENT CLASS** Functional Assessment Tool Used: AM-PAC 6 Clicks Basic Mobility;Clinical judgement Functional Limitation: Mobility: Walking and moving around Mobility: Walking and Moving Around Current Status (K9179): At least 60 percent but less than 80 percent impaired, limited or restricted Mobility: Walking and Moving Around Goal Status [id number]): At least 20 percent but less than 40 percent impaired, limited or restricted    Scheryl Marten PT, DPT  [phone]   Shanon Rosser 07/28/2016, 9:38 AM

## 2016-07-28 NOTE — Progress Notes (Signed)
Physical Therapy Treatment Patient Details Name: Alicia Joseph MRN: [id number] DOB: 1929-08-16 Today's Date: 07/28/2016    History of Present Illness Pt is a 83 yo female admitted through ED following a fall on 07/27/16 resulting in a left humeral head fracture. The fracture is non-operative at this time. PMH significant for osteopenia, HTN, HLD, memory loss.     PT Comments    Patient is progressing toward mobility goals. Pt tolerated gait/stair training this session with use of SPC. Pt overall min A for mobility. Pt is unsteady and confused at times requiring mod cues for safety and use of shoulder sling throughout session. If pt is to d/c home she will need 24 hour assistance/supervision initially.  Continue to progress as tolerated.    Follow Up Recommendations  Home health PT;Supervision/Assistance - 24 hour;SNF     Equipment Recommendations  3in1 (PT)    Recommendations for Other Services OT consult     Precautions / Restrictions Precautions Precautions: Shoulder Shoulder Interventions: Shoulder sling/immobilizer;At all times Precaution Comments: reviewed precautions and use of sling Restrictions Weight Bearing Restrictions: Yes LUE Weight Bearing: Non weight bearing    Mobility  Bed Mobility               General bed mobility comments: pt OOB in chair upon arrival  Transfers Overall transfer level: Needs assistance   Transfers: Sit to/from Stand Sit to Stand: Min assist         General transfer comment: assist to steady upon standing  Ambulation/Gait Ambulation/Gait assistance: Min assist Ambulation Distance (Feet): 80 Feet Assistive device: Straight cane Gait Pattern/deviations: Step-through pattern;Decreased stride length;Drifts right/left Gait velocity: decreased   General Gait Details: cues for sequencing of 3 point gait and safe use of AD; unsteady however no LOB   Stairs Stairs: Yes   Stair Management: No rails;Step to  pattern;Forwards;With cane Number of Stairs: 2 General stair comments: cues for sequencing and technique; assist to steady  Wheelchair Mobility    Modified Rankin (Stroke Patients Only)       Balance Overall balance assessment: History of Falls;Needs assistance Sitting-balance support: Single extremity supported;Feet supported Sitting balance-Leahy Scale: Fair   Postural control: Right lateral lean Standing balance support: Single extremity supported;During functional activity Standing balance-Leahy Scale: Fair Standing balance comment: requires single UE support for balance                            Cognition Arousal/Alertness: Awake/alert Behavior During Therapy: WFL for tasks assessed/performed Overall Cognitive Status: No family/caregiver present to determine baseline cognitive functioning Area of Impairment: Memory;Following commands;Safety/judgement                     Memory: Decreased short-term memory Following Commands: Follows one step commands inconsistently;Follows one step commands with increased time Safety/Judgement: Decreased awareness of safety     General Comments: pt appeared confused at times and easily distracted during tasks      Exercises      General Comments        Pertinent Vitals/Pain Pain Assessment: Faces Faces Pain Scale: Hurts even more Pain Location: L shoulder when leaning trunk anteriorly Pain Descriptors / Indicators: Aching;Grimacing;Guarding Pain Intervention(s): Monitored during session;Premedicated before session;Repositioned    Home Living                      Prior Function            PT  Goals (current goals can now be found in the care plan section) Acute Rehab PT Goals PT Goal Formulation: With patient Time For Goal Achievement: 08/04/16 Potential to Achieve Goals: Good Progress towards PT goals: Progressing toward goals    Frequency    Min 3X/week      PT Plan Current  plan remains appropriate    Co-evaluation              AM-PAC PT "6 Clicks" Daily Activity  Outcome Measure  Difficulty turning over in bed (including adjusting bedclothes, sheets and blankets)?: Total Difficulty moving from lying on back to sitting on the side of the bed? : Total Difficulty sitting down on and standing up from a chair with arms (e.g., wheelchair, bedside commode, etc,.)?: Total Help needed moving to and from a bed to chair (including a wheelchair)?: A Little Help needed walking in hospital room?: A Little Help needed climbing 3-5 steps with a railing? : A Little 6 Click Score: 12    End of Session Equipment Utilized During Treatment: Gait belt;Other (comment) (Left shoulder sling) Activity Tolerance: Patient tolerated treatment well Patient left: in chair;with call bell/phone within reach Nurse Communication: Mobility status PT Visit Diagnosis: Unsteadiness on feet (R26.81);Difficulty in walking, not elsewhere classified (R26.2)     Time: 8257-4935 PT Time Calculation (min) (ACUTE ONLY): 25 min  Charges:  $Gait Training: 23-37 mins                    G Codes:       Earney Navy, PTA Pager: [phone]     Darliss Cheney 07/28/2016, 3:46 PM

## 2016-07-29 ENCOUNTER — Telehealth: Payer: Self-pay | Admitting: Family Medicine

## 2016-07-29 DIAGNOSIS — R413 Other amnesia: Secondary | ICD-10-CM

## 2016-07-29 DIAGNOSIS — E876 Hypokalemia: Secondary | ICD-10-CM | POA: Diagnosis not present

## 2016-07-29 DIAGNOSIS — W19XXXD Unspecified fall, subsequent encounter: Secondary | ICD-10-CM | POA: Diagnosis not present

## 2016-07-29 DIAGNOSIS — M8580 Other specified disorders of bone density and structure, unspecified site: Secondary | ICD-10-CM

## 2016-07-29 DIAGNOSIS — I10 Essential (primary) hypertension: Secondary | ICD-10-CM

## 2016-07-29 DIAGNOSIS — S42212A Unspecified displaced fracture of surgical neck of left humerus, initial encounter for closed fracture: Secondary | ICD-10-CM | POA: Diagnosis not present

## 2016-07-29 DIAGNOSIS — S42292A Other displaced fracture of upper end of left humerus, initial encounter for closed fracture: Secondary | ICD-10-CM | POA: Diagnosis not present

## 2016-07-29 DIAGNOSIS — M79602 Pain in left arm: Secondary | ICD-10-CM

## 2016-07-29 DIAGNOSIS — S42212D Unspecified displaced fracture of surgical neck of left humerus, subsequent encounter for fracture with routine healing: Secondary | ICD-10-CM | POA: Diagnosis not present

## 2016-07-29 DIAGNOSIS — D62 Acute posthemorrhagic anemia: Secondary | ICD-10-CM | POA: Diagnosis not present

## 2016-07-29 DIAGNOSIS — Y92009 Unspecified place in unspecified non-institutional (private) residence as the place of occurrence of the external cause: Secondary | ICD-10-CM

## 2016-07-29 DIAGNOSIS — R739 Hyperglycemia, unspecified: Secondary | ICD-10-CM | POA: Diagnosis not present

## 2016-07-29 DIAGNOSIS — W19XXXA Unspecified fall, initial encounter: Secondary | ICD-10-CM | POA: Diagnosis not present

## 2016-07-29 LAB — BASIC METABOLIC PANEL
Anion gap: 7 (ref 5–15)
BUN: 10 mg/dL (ref 6–20)
CO2: 27 mmol/L (ref 22–32)
Calcium: 8.6 mg/dL — ABNORMAL LOW (ref 8.9–10.3)
Chloride: 103 mmol/L (ref 101–111)
Creatinine, Ser: 0.58 mg/dL (ref 0.44–1.00)
GFR calc Af Amer: >60 mL/min (ref >60)
GFR calc non Af Amer: >60 mL/min (ref >60)
GLUCOSE: 117 mg/dL — AB (ref 65–99)
POTASSIUM: 3.9 mmol/L (ref 3.5–5.1)
SODIUM: 137 mmol/L (ref 135–145)

## 2016-07-29 LAB — CBC
HCT: 32.8 % — ABNORMAL LOW (ref 36.0–46.0)
Hemoglobin: 10.9 g/dL — ABNORMAL LOW (ref 12.0–15.0)
MCH: 31 pg (ref 26.0–34.0)
MCHC: 33.2 g/dL (ref 30.0–36.0)
MCV: 93.2 fL (ref 78.0–100.0)
PLATELETS: 227 10*3/uL (ref 150–400)
RBC: 3.52 MIL/uL — ABNORMAL LOW (ref 3.87–5.11)
RDW: 13.6 % (ref 11.5–15.5)
WBC: 9.4 10*3/uL (ref 4.0–10.5)

## 2016-07-29 NOTE — Progress Notes (Signed)
Discharged patient and NT walked patient and patient's daughter downstairs.  NT called me 45 minutes later stating patient's daughter never returned from the parking deck.  I told him to come back to the floor.  The ED then called the unit and stated daughter was there trying to find her mom and could not keep her eyes open.  Did not think it was safe for her to drive daughter home. Security brought patient's daughter back to our unit.  The [facility] also come up to unit.  The director and myself talked to patient to see if she felt safe going home with her daughter.  She stated she did.  The [facility] felt it was safe for patient to go home with daughter. NT took patient and daughter back outside.  Daughter could not find her car so called security to take her to her car and followed security back to the [employer] entrance to pick up her mother.

## 2016-07-29 NOTE — Progress Notes (Signed)
Occupational Therapy Treatment Patient Details Name: Alicia Joseph MRN: [id number] DOB: Apr 15, 1929 Today's Date: 07/29/2016    History of present illness Pt is a 83 yo female admitted through ED following a fall on 07/27/16 resulting in a left humeral head fracture. The fracture is non-operative at this time. PMH significant for osteopenia, HTN, HLD, memory loss.    OT comments  Pt progressing towards established goals. However, continues to have poor occupational performance due to significant pain. Provided education on walk-in shower transfer with 3N1. Pt performed transfer with Min guard A and Mod VCs for sequencing of task. Re-educated pt on sling positioning and shoulder precautions. Will re-visit to address UB dressing and bathing when daughter present to increase safety and carry over to home. Recommend dc to home with 24 hour supervision, [place], and [place] aide.    Follow Up Recommendations  Home health OT;Supervision/Assistance - 24 hour;HHaide   Equipment Recommendations  3 in 1 bedside commode    Recommendations for Other Services      Precautions / Restrictions Precautions Precautions: Shoulder Type of Shoulder Precautions: No shoulder ROM. Wear sling all times except for dressing/bathing. Shoulder Interventions: Shoulder sling/immobilizer;At all times Precaution Comments: reviewed precautions and use of sling Required Braces or Orthoses: Sling Restrictions Weight Bearing Restrictions: Yes LUE Weight Bearing: Non weight bearing       Mobility Bed Mobility Overal bed mobility: Needs Assistance Bed Mobility: Supine to Sit     Supine to sit: Min assist     General bed mobility comments: Min A to support L shoulder and Mod VCs for sequencing. Pt distracted by pain.   Transfers Overall transfer level: Needs assistance   Transfers: Sit to/from Stand Sit to Stand: Min assist Stand pivot transfers: Min assist       General transfer comment: assist to steady upon  standing    Balance Overall balance assessment: History of Falls;Needs assistance Sitting-balance support: Single extremity supported;Feet supported Sitting balance-Leahy Scale: Fair   Postural control: Right lateral lean Standing balance support: Single extremity supported;During functional activity Standing balance-Leahy Scale: Fair Standing balance comment: Min guard-Min A for standing balance                           ADL either performed or assessed with clinical judgement   ADL Overall ADL's : Needs assistance/impaired Eating/Feeding: Set up;Sitting Eating/Feeding Details (indicate cue type and reason): Set up pt for optimal self feeding on recliner Grooming: Standing;Min guard;Wash/dry hands Grooming Details (indicate cue type and reason): Pt washed her R hand after toileting           Upper Body Dressing Details (indicate cue type and reason): Educated pt on sling positioning and donning/doffing. Pt limited by pain. Will need to educate on UB dressing at next session prior to dc             Tub/ Shower Transfer: Walk-in shower;Ambulation;3 in 1 Tub/Shower Transfer Details (indicate cue type and reason): Educated pt on safe shower transfer with Min guard and Mod VCs for sequencing of task Functional mobility during ADLs: Min guard General ADL Comments: Pt continues to have decreased occupational performance due to pain. Pt requires Mod VCs throughout session to maintain precautions and sequence tasks. Will need to provided education on UB dressing when her daughter is present to increase safety and carry over to home.     Vision       Perception     Praxis  Cognition Arousal/Alertness: Awake/alert Behavior During Therapy: WFL for tasks assessed/performed Overall Cognitive Status: No family/caregiver present to determine baseline cognitive functioning Area of Impairment: Memory;Following commands;Safety/judgement                      Memory: Decreased short-term memory;Decreased recall of precautions Following Commands: Follows one step commands inconsistently;Follows one step commands with increased time Safety/Judgement: Decreased awareness of safety     General Comments: pt appeared confused at times and easily distracted during tasks.        Exercises Exercises: Shoulder   Shoulder Instructions Shoulder Instructions Donning/doffing sling/immobilizer: Maximal assistance Correct positioning of sling/immobilizer: Maximal assistance     General Comments      Pertinent Vitals/ Pain       Pain Assessment: Faces Faces Pain Scale: Hurts worst Pain Location: L shoulder when leaning trunk anteriorly Pain Descriptors / Indicators: Aching;Grimacing;Guarding Pain Intervention(s): Limited activity within patient's tolerance;Monitored during session  Home Living                                          Prior Functioning/Environment              Frequency  Min 2X/week        Progress Toward Goals  OT Goals(current goals can now be found in the care plan section)  Progress towards OT goals: Progressing toward goals  Acute Rehab OT Goals Patient Stated Goal: to get back home and have less pain OT Goal Formulation: With patient Time For Goal Achievement: 08/11/16 Potential to Achieve Goals: Good ADL Goals Pt Will Perform Grooming: with set-up;with supervision;standing Pt Will Perform Upper Body Dressing: with min assist;with caregiver independent in assisting;sitting Pt Will Perform Lower Body Dressing: with min assist;with caregiver independent in assisting;with adaptive equipment;sit to/from stand Pt Will Transfer to Toilet: with min guard assist;bedside commode;ambulating Pt Will Perform Toileting - Clothing Manipulation and hygiene: with min guard assist;sit to/from stand Pt Will Perform Tub/Shower Transfer: Shower transfer;3 in 1;ambulating;with min guard assist  Plan Discharge  plan remains appropriate    Co-evaluation                 AM-PAC PT "6 Clicks" Daily Activity     Outcome Measure   Help from another person eating meals?: None Help from another person taking care of personal grooming?: A Little Help from another person toileting, which includes using toliet, bedpan, or urinal?: A Little Help from another person bathing (including washing, rinsing, drying)?: A Lot Help from another person to put on and taking off regular upper body clothing?: A Lot Help from another person to put on and taking off regular lower body clothing?: A Lot 6 Click Score: 16    End of Session Equipment Utilized During Treatment: Other (comment) (Sling)  OT Visit Diagnosis: Unsteadiness on feet (R26.81);Other abnormalities of gait and mobility (R26.89);Muscle weakness (generalized) (M62.81);Pain Pain - Right/Left: Left Pain - part of body: Shoulder   Activity Tolerance Patient limited by pain   Patient Left in chair;with call bell/phone within reach   Nurse Communication Mobility status;Precautions        Time: 3244-0102 OT Time Calculation (min): 33 min  Charges: OT General Charges $OT Visit: 1 Procedure OT Treatments $Self Care/Home Management : 23-37 mins  [place], OTR/L [phone]   [place] 07/29/2016, 10:07 AM

## 2016-07-29 NOTE — Progress Notes (Signed)
I met with pt at bedside and then spoke with her daughter by phone. Daughter plans for pt to d/c to her home to a first level area in her home. She reports she can provide 24/7 assist at home. She is aware that d/c is planned today. Pt is in agreement to plan direct d/c home. Insurance will not approve an inpt rehab admission for this diagnosis nor under observation status. I recommend home with HH as per PT and OT recommendations. I discussed with Stanton Kidney, RN. We will sign off. [phone]

## 2016-07-29 NOTE — Progress Notes (Signed)
Reviewed discharge instructions/medications with patient.  Answered her questions.  Still waiting for patient's daughter to get here so they can both work with OT before she goes home. Patient and daughter states she will be staying with daughter.

## 2016-07-29 NOTE — Consult Note (Signed)
ORTHOPAEDIC CONSULTATION  REQUESTING PHYSICIAN: Mendel Corning, MD  PCP:  Midge Minium, MD  Chief Complaint: Left proximal humerus fracture  HPI: Alicia Joseph is a 83 y.o. female who complains of left shoulder pain. She tripped and fell in her bedroom on 07/27/2016. She came to the emergency department and x-rays revealed a comminuted left proximal humerus fracture. She was placed into a sling. She was admitted by the hospitalist for disposition planning. She denies other injuries.  Past Medical History:  Diagnosis Date  . Chest pain   . FH: colonic polyps   . Hyperlipidemia   . Hypertension   . Menopause   . Osteopenia    Past Surgical History:  Procedure Laterality Date  . ABDOMINAL HYSTERECTOMY    . CHOLECYSTECTOMY     Social History   Social History  . Marital status: Married    Spouse name: N/A  . Number of children: N/A  . Years of education: N/A   Social History Main Topics  . Smoking status: Never Smoker  . Smokeless tobacco: Never Used  . Alcohol use Yes  . Drug use: No  . Sexual activity: Not Asked   Other Topics Concern  . None   Social History Narrative  . None   Family History  Problem Relation Age of Onset  . Cancer Sister        breast  . Cancer Father        lung  . Diabetes Brother   . Cancer Daughter        thyroid   Allergies  Allergen Reactions  . Atorvastatin     REACTION: pain in feet  . Ezetimibe-Simvastatin     REACTION: pain in feet   Prior to Admission medications   Medication Sig Start Date End Date Taking? Authorizing Provider  aspirin EC 81 MG tablet Take 81 mg by mouth daily.   Yes [provider]  ezetimibe (ZETIA) 10 MG tablet Take 1 tablet (10 mg total) by mouth daily. 07/17/16  Yes Midge Minium, MD  losartan (COZAAR) 100 MG tablet Take 1 tablet (100 mg total) by mouth daily. 07/17/16  Yes Midge Minium, MD  acetaminophen (TYLENOL) 325 MG tablet Take 2 tablets (650 mg total) by mouth  every 6 (six) hours as needed for mild pain or moderate pain. 07/28/16   Rai, Vernelle Emerald, MD  HYDROcodone-acetaminophen (NORCO/VICODIN) 5-325 MG tablet Take 1 tablet by mouth every 4 (four) hours as needed for severe pain. 07/28/16   Rai, Vernelle Emerald, MD  polyethylene glycol (MIRALAX / GLYCOLAX) packet Take 17 g by mouth daily as needed for moderate constipation. 07/28/16   Rai, Ripudeep K, MD  senna-docusate (SENOKOT-S) 8.6-50 MG tablet Take 1 tablet by mouth 2 (two) times daily. For constipation 07/28/16   Rai, Vernelle Emerald, MD   No results found.  Positive ROS: All other systems have been reviewed and were otherwise negative with the exception of those mentioned in the HPI and as above.  Physical Exam: General: Alert, no acute distress Cardiovascular: No pedal edema Respiratory: No cyanosis, no use of accessory musculature GI: No organomegaly, abdomen is soft and non-tender Skin: No lesions in the area of chief complaint Neurologic: Sensation intact distally Psychiatric: Patient is competent for consent with normal mood and affect Lymphatic: No axillary or cervical lymphadenopathy  MUSCULOSKELETAL: LUE/BLE: No wounds. No tenderness to palpation, crepitation, or blocks to motion. Full painless range of motion. RUE: Skin is intact. She has bruising  over the brachium. 2+ radial pulse. Positive motor function AIN, PIN, and ulnar motor nerves. Intact sensation to axillary, musculocutaneous, radial, ulnar, and median distributions.  Assessment: Left proximal humerus fracture, amenable to conservative management  Plan: I discussed the findings with the patient. We discussed conservative nonoperative management of her left proximal humerus fracture. Nonweightbearing left upper extremity. She will continue the sling at all times. She may remove the sling once daily for hygiene and axillary care. She will need to follow-up in the office in 1 week for repeat radiographs.    Meighan Treto, Horald Pollen,  MD Cell [phone]    07/29/2016 10:39 AM

## 2016-07-29 NOTE — Telephone Encounter (Signed)
Called and gave verbal ok per discharge summary pt is now returning home.

## 2016-07-29 NOTE — Progress Notes (Signed)
Physical Therapy Treatment Patient Details Name: Alicia Joseph MRN: [id number] DOB: 18-Apr-1929 Today's Date: 07/29/2016    History of Present Illness Pt is a 83 yo female admitted through ED following a fall on 07/27/16 resulting in a left humeral head fracture. The fracture is non-operative at this time. PMH significant for osteopenia, HTN, HLD, memory loss.     PT Comments    Pt is slowly making progress towards her goals. Pt is currently min A for transfers with single point cane, min  A for ambulation of 100 feet with single point cane and ascent/descent of 10 steps with single point cane. Pt requires minA in all mobility for stability and requires 24 hour care to safely mobilize. Pt requires skilled PT to progress transfer, gait and stair training and to improve balance, and LE strength to be able to safely navigate in her discharge environment.     Follow Up Recommendations  Home health PT;Supervision/Assistance - 24 hour;SNF     Equipment Recommendations  3in1 (PT)    Recommendations for Other Services OT consult     Precautions / Restrictions Precautions Precautions: Shoulder Type of Shoulder Precautions: No shoulder ROM. Wear sling all times except for dressing/bathing. Shoulder Interventions: Shoulder sling/immobilizer;At all times Precaution Comments: reviewed precautions and use of sling Required Braces or Orthoses: Sling Restrictions Weight Bearing Restrictions: Yes LUE Weight Bearing: Non weight bearing    Mobility  Bed Mobility               General bed mobility comments: pt OOB in chair upon arrival  Transfers Overall transfer level: Needs assistance   Transfers: Sit to/from Stand Sit to Stand: Min assist         General transfer comment: assist to steady upon standing  Ambulation/Gait Ambulation/Gait assistance: Min assist   Assistive device: Straight cane Gait Pattern/deviations: Step-through pattern;Decreased stride length;Drifts  right/left;Narrow base of support Gait velocity: decreased   General Gait Details: pt struggled to understand proper cane use and sequencing of 3 point gait and keeping cane close to her   Stairs Stairs: Yes   Stair Management: No rails;Step to pattern;Forwards;With cane Number of Stairs: 5 General stair comments: cues for sequencing and technique; assist to steady      Balance Overall balance assessment: History of Falls;Needs assistance Sitting-balance support: Single extremity supported;Feet supported Sitting balance-Leahy Scale: Fair     Standing balance support: Single extremity supported;During functional activity Standing balance-Leahy Scale: Fair Standing balance comment: requires single UE support for balance                            Cognition Arousal/Alertness: Awake/alert Behavior During Therapy: WFL for tasks assessed/performed Overall Cognitive Status: No family/caregiver present to determine baseline cognitive functioning Area of Impairment: Memory;Following commands;Safety/judgement                     Memory: Decreased short-term memory Following Commands: Follows one step commands inconsistently;Follows one step commands with increased time Safety/Judgement: Decreased awareness of safety     General Comments: pt easily distracted from tasks at hand       Exercises      General Comments General comments (skin integrity, edema, etc.): Pt not sure of whether she is going home to her house or her daughter's and how she will stay on single floor       Pertinent Vitals/Pain Pain Assessment: Faces Faces Pain Scale: Hurts even more Pain Location:  L shoulder especially with transfers Pain Descriptors / Indicators: Aching;Grimacing;Guarding Pain Intervention(s): Limited activity within patient's tolerance;Monitored during session;Premedicated before session;Ice applied  VSS           PT Goals (current goals can now be found in  the care plan section) Acute Rehab PT Goals PT Goal Formulation: With patient Time For Goal Achievement: 08/04/16 Potential to Achieve Goals: Good Progress towards PT goals: Progressing toward goals    Frequency    Min 3X/week      PT Plan Current plan remains appropriate       AM-PAC PT "6 Clicks" Daily Activity  Outcome Measure  Difficulty turning over in bed (including adjusting bedclothes, sheets and blankets)?: Total Difficulty moving from lying on back to sitting on the side of the bed? : Total Difficulty sitting down on and standing up from a chair with arms (e.g., wheelchair, bedside commode, etc,.)?: Total Help needed moving to and from a bed to chair (including a wheelchair)?: A Little Help needed walking in hospital room?: A Little Help needed climbing 3-5 steps with a railing? : A Little 6 Click Score: 12    End of Session Equipment Utilized During Treatment: Gait belt;Other (comment) (Left shoulder sling) Activity Tolerance: Patient tolerated treatment well Patient left: in chair;with call bell/phone within reach Nurse Communication: Mobility status PT Visit Diagnosis: Unsteadiness on feet (R26.81);Difficulty in walking, not elsewhere classified (R26.2)     Time: 0160-1093 PT Time Calculation (min) (ACUTE ONLY): 37 min  Charges:  $Gait Training: 8-22 mins $Therapeutic Activity: 8-22 mins                    G Codes:  Functional Assessment Tool Used: AM-PAC 6 Clicks Basic Mobility;Clinical judgement Functional Limitation: Mobility: Walking and moving around Mobility: Walking and Moving Around Current Status (A3557): At least 60 percent but less than 80 percent impaired, limited or restricted Mobility: Walking and Moving Around Goal Status [phone]): At least 20 percent but less than 40 percent impaired, limited or restricted    Benjamine Mola B. Migdalia Dk PT, DPT Acute Rehabilitation  [phone] Pager [phone]     [place] 07/29/2016, 1:26 PM

## 2016-07-29 NOTE — Discharge Summary (Signed)
Physician Discharge Summary   Patient ID: MILO SOLANA MRN: [id number] DOB/AGE: 1929/06/15 83 y.o.  Admit date: 07/27/2016 Discharge date: 07/29/2016  Primary Care Physician:  Midge Minium, MD  Discharge Diagnoses:   . Humeral surgical neck fracture . Hyperlipidemia . Hypertension . Hypokalemia . Mechanical fall   Consults:  Orthopedics, Dr Lyla Glassing  Recommendations for Outpatient Follow-up:  1. Home health, PT, OT, home health aide, RN arranged 2. Patient recommended left shoulder sling at all times, removed the sling once daily for hygiene and axillary care, follow-up in office with orthopedics for repeat radiographs   DIET: Heart healthy diet    Allergies:   Allergies  Allergen Reactions  . Atorvastatin     REACTION: pain in feet  . Ezetimibe-Simvastatin     REACTION: pain in feet     DISCHARGE MEDICATIONS: Current Discharge Medication List    START taking these medications   Details  acetaminophen (TYLENOL) 325 MG tablet Take 2 tablets (650 mg total) by mouth every 6 (six) hours as needed for mild pain or moderate pain. Qty: 30 tablet, Refills: 0    HYDROcodone-acetaminophen (NORCO/VICODIN) 5-325 MG tablet Take 1 tablet by mouth every 4 (four) hours as needed for severe pain. Qty: 30 tablet, Refills: 0    polyethylene glycol (MIRALAX / GLYCOLAX) packet Take 17 g by mouth daily as needed for moderate constipation. Qty: 30 each, Refills: 0    senna-docusate (SENOKOT-S) 8.6-50 MG tablet Take 1 tablet by mouth 2 (two) times daily. For constipation Qty: 60 tablet, Refills: 0      CONTINUE these medications which have NOT CHANGED   Details  aspirin EC 81 MG tablet Take 81 mg by mouth daily.    ezetimibe (ZETIA) 10 MG tablet Take 1 tablet (10 mg total) by mouth daily. Qty: 30 tablet, Refills: 6    losartan (COZAAR) 100 MG tablet Take 1 tablet (100 mg total) by mouth daily. Qty: 30 tablet, Refills: 3         Brief H and P: For complete  details please refer to admission H and P, but in brief Patient is a 83 year old female with osteopenia, hypertension, hyperlipidemia presented with left shoulder fracture after mechanical fall. Patient got up in the middle of the night returns in conditioner off and she did not turn on the light and tripped over her furniture. X-ray showed displaced/comminuted fracture of the left humeral neck. Orthopedics/Dr. Lyla Glassing was consulted by EDP who recommended nonoperative management, sling at all times and follow-up in one week.  Hospital Course:  Humeral surgical neck fracture Secondary to mechanical fall - X-ray showed displaced/comminuted fracture of the left humeral neck - Orthopedic surgery, Dr. Lyla Glassing was consulted who recommended sling at all times and nonoperative management, follow-up in one week - PT evaluation was done, patient was also seen by inpatient rehabilitation. At this point patient is recommended home health PT which was arranged by case management. Patient's daughter will provide 24/7 assist at home. - Patient will follow up with orthopedics in one week. - DME rolling walker and DME 3 n1 arranged    Hypokalemia - Currently resolved   hyperlipidemia Continue Zetia  Hypertension Continue Cozaar    Day of Discharge BP (!) 167/64 (BP Location: Right Arm)   Pulse 83   Temp 98.7 F (37.1 C) (Oral)   Resp 18   SpO2 94%   Physical Exam: General: Alert and awake oriented x3 not in any acute distress. HEENT: anicteric sclera, pupils reactive to  light and accommodation CVS: S1-S2 clear no murmur rubs or gallops Chest: clear to auscultation bilaterally, no wheezing rales or rhonchi Abdomen: soft nontender, nondistended, normal bowel sounds Extremities: no cyanosis, clubbing or edema noted bilaterally, left shoulder sling  Neuro: Cranial nerves II-XII intact, no focal neurological deficits   The results of significant diagnostics from this hospitalization (including  imaging, microbiology, ancillary and laboratory) are listed below for reference.    LAB RESULTS: Basic Metabolic Panel:  Recent Labs Lab 07/27/16 1315 07/28/16 0453 07/29/16 0508  NA  --  135 137  K  --  4.5 3.9  CL  --  105 103  CO2  --  24 27  GLUCOSE  --  116* 117*  BUN  --  8 10  CREATININE  --  0.60 0.58  CALCIUM  --  8.4* 8.6*  MG 1.9  --   --    Liver Function Tests: No results for input(s): AST, ALT, ALKPHOS, BILITOT, PROT, ALBUMIN in the last 168 hours. No results for input(s): LIPASE, AMYLASE in the last 168 hours. No results for input(s): AMMONIA in the last 168 hours. CBC:  Recent Labs Lab 07/27/16 1202 07/28/16 0453 07/29/16 0508  WBC 15.1* 9.4 9.4  NEUTROABS 13.1*  --   --   HGB 11.8* 11.6* 10.9*  HCT 35.5* 35.0* 32.8*  MCV 91.7 93.6 93.2  PLT 246 228 227   Cardiac Enzymes:  Recent Labs Lab 07/27/16 1315 07/28/16 0453  CKTOTAL 90 112   BNP: Invalid input(s): POCBNP CBG: No results for input(s): GLUCAP in the last 168 hours.  Significant Diagnostic Studies:  Dg Chest 1 View  Result Date: 07/27/2016 CLINICAL DATA:  Patient fell this AM, left sided pain. Primary pain in left shoulder. EXAM: CHEST 1 VIEW COMPARISON:  Chest x-ray dated 05/13/2016. FINDINGS: Heart size and mediastinal contours are stable. Atherosclerotic changes noted aortic arch. Coarse lung markings bilaterally suggests some degree of chronic interstitial fibrosis. No pleural effusion or pneumothorax seen. Again noted is scoliotic deformity of the thoracolumbar spine, with associated mild degenerative change. No osseous fracture or dislocation seen. The left humeral neck fracture, appreciated on other plain films from today, is not imaged on this exam. IMPRESSION: No active disease. No evidence of pneumonia or pulmonary edema. Probable chronic interstitial lung disease. Aortic atherosclerosis. Electronically Signed   By: Franki Cabot M.D.   On: 07/27/2016 10:17   Dg Pelvis 1-2  Views  Result Date: 07/27/2016 CLINICAL DATA:  Patient fell this AM, left sided pain. Primary pain in left shoulder. EXAM: PELVIS - 1-2 VIEW COMPARISON:  None. FINDINGS: There is no evidence of pelvic fracture or diastasis. Stable appearance of the sclerotic lesion in the intratrochanteric region of the left femoral neck, compatible with benign bone island or old bone infarct. Mild degenerative change at each hip joint. Irregularity of the left femoral head is likely related to the underlying degenerative change, possibly sequela of a chronic AVN. IMPRESSION: 1. No acute findings.  No osseous fracture or dislocation. 2. Chronic/degenerative changes at the left hip, suspected chronic AVN at the superomedial margin of the left femoral head. Electronically Signed   By: Franki Cabot M.D.   On: 07/27/2016 10:22   Dg Shoulder Right  Result Date: 07/27/2016 CLINICAL DATA:  Fall, left-sided shoulder pain. EXAM: RIGHT SHOULDER - 2+ VIEW COMPARISON:  None. FINDINGS: Displaced/comminuted fracture within the left humeral neck. There appears to be associated avulsion of the greater tuberosity which is displaced towards the overlying acromion.  There is at least mild inferomedial subluxation of the humeral head, likely related to associated joint effusion. IMPRESSION: 1. Displaced/comminuted fracture of the left humeral neck. Avulsed greater tuberosity is superiorly displaced towards the overlying acromion. 2. At least mild inferomedial subluxation of the humeral head, likely related to associated joint effusion. Electronically Signed   By: Franki Cabot M.D.   On: 07/27/2016 10:15   Dg Forearm Left  Result Date: 07/27/2016 CLINICAL DATA:  Patient fell this AM, left sided pain. Primary pain in left shoulder. EXAM: LEFT FOREARM - 2 VIEW COMPARISON:  None. FINDINGS: There is no evidence of fracture or other focal bone lesions. Soft tissues are unremarkable. IMPRESSION: Negative. Electronically Signed   By: Franki Cabot  M.D.   On: 07/27/2016 10:18   Dg Humerus Left  Result Date: 07/27/2016 CLINICAL DATA:  Patient fell this AM, left sided pain. Primary pain in left shoulder. EXAM: LEFT HUMERUS - 2+ VIEW COMPARISON:  None. FINDINGS: Displaced/comminuted fracture of the left humeral neck. Mid and distal portions of the left humerus appear intact and normally aligned. IMPRESSION: 1. Displaced/comminuted fracture of the left humeral neck, as described in detail on earlier plain film report of the left shoulder. 2. Mid and distal portions of the left humerus are intact and normally aligned. Electronically Signed   By: Franki Cabot M.D.   On: 07/27/2016 10:19   Dg Femur Min 2 Views Left  Result Date: 07/27/2016 CLINICAL DATA:  Patient fell this AM, left sided pain. Primary pain in left shoulder. EXAM: LEFT FEMUR 2 VIEWS COMPARISON:  None. FINDINGS: There is no evidence of fracture or other focal bone lesions. Soft tissues are unremarkable. Atherosclerosis of the femoral and popliteal artery. IMPRESSION: No acute findings. No osseous fracture or dislocation. Atherosclerosis of the femoral and popliteal arteries. Electronically Signed   By: Franki Cabot M.D.   On: 07/27/2016 10:18    2D ECHO:   Disposition and Follow-up: Discharge Instructions    Diet - low sodium heart healthy    Complete by:  As directed    Increase activity slowly    Complete by:  As directed        DISPOSITION home health PT OT:    DISCHARGE FOLLOW-UP Follow-up Information    [place], Aaron Edelman, MD. Schedule an appointment as soon as possible for a visit in 1 week(s).   Specialty:  Orthopedic Surgery Why:  for follow-up and Xrays  Contact information: [address] [place] [address] [phone]        Midge Minium, MD. Schedule an appointment as soon as possible for a visit in 3 week(s).   Specialty:  Family Medicine Contact information: [address] [place] [address] [phone]         [place] [place] Follow up.   Specialty:  Home Health Services Why:  this agency will call you to schedule your at home physical and occupational therapy, nurse, and aide services Contact information: [place] [address] [phone]        [facility] - Dme Follow up.   Why:  rollator and 3n1 Contact information: [address] [place] [address] [phone]            Time spent on Discharge: 58mins   Signed:   Estill Cotta M.D. [facility] 07/29/2016, 12:36 PM Pager: [phone]

## 2016-07-29 NOTE — Care Management Obs Status (Signed)
[facility] NOTIFICATION   Patient Details  Name: ZEHRA RUCCI MRN: [id number] Date of Birth: 1929-08-01   Medicare Observation Status Notification Given:  Yes    Carles Collet, RN 07/29/2016, 11:11 AM

## 2016-07-29 NOTE — Progress Notes (Signed)
Occupational Therapy Treatment Patient Details Name: Alicia Joseph MRN: [id number] DOB: 15-Jun-1929 Today's Date: 07/29/2016    History of present illness Pt is a 83 yo female admitted through ED following a fall on 07/27/16 resulting in a left humeral head fracture. The fracture is non-operative at this time. PMH significant for osteopenia, HTN, HLD, memory loss.    OT comments  Upon arrival, pt was standing up without sling talking with her daughter. Recommended pt sit and re-educated her on the need for sling. Provided pt and daughter with shoulder handout to increase carry over to home of precautions, UE ADLs, and positioning. Pt performed dressing with daughter A and Mod VCs for sequencing of task to adhere to precautions. Educated pt on shower transfer with 3N1. Answered pt and daughter questions and provided needed education in preparation for dc home. Continue to recommend 24 hour supervision at home to increase safety.    Follow Up Recommendations  Home health OT;Supervision/Assistance - 24 hour;SNF (HHOT vs SNF pending pt's progress)    Equipment Recommendations  3 in 1 bedside commode    Recommendations for Other Services      Precautions / Restrictions Precautions Precautions: Shoulder Type of Shoulder Precautions: No shoulder ROM. Wear sling all times except for dressing/bathing. Shoulder Interventions: Shoulder sling/immobilizer;At all times Precaution Comments: reviewed precautions and use of sling Required Braces or Orthoses: Sling Restrictions Weight Bearing Restrictions: Yes LUE Weight Bearing: Non weight bearing       Mobility Bed Mobility               General bed mobility comments: Pt standing up in room with daughter without sling  Transfers Overall transfer level: Needs assistance   Transfers: Sit to/from Stand Sit to Stand: Min assist         General transfer comment: assist to steady upon standing    Balance Overall balance assessment:  History of Falls;Needs assistance Sitting-balance support: Feet supported;No upper extremity supported Sitting balance-Leahy Scale: Fair     Standing balance support: Single extremity supported;During functional activity Standing balance-Leahy Scale: Fair Standing balance comment: Min guard-Min A for standing balance                           ADL either performed or assessed with clinical judgement   ADL Overall ADL's : Needs assistance/impaired         Upper Body Bathing: Minimal assistance;Sitting;Cueing for sequencing;Cueing for compensatory techniques;Cueing for UE precautions Upper Body Bathing Details (indicate cue type and reason): Educated pt and daughter on UB bathing techniques to maintain shoulder precautions.      Upper Body Dressing : Moderate assistance;Sitting;Cueing for UE precautions;Cueing for sequencing;Cueing for compensatory techniques Upper Body Dressing Details (indicate cue type and reason): Educated pt and daughter on UB dressing techniques. Requires Max VCs for daughter and pt to perform tasks and maintain shoulder precautions Lower Body Dressing: Moderate assistance;Sit to/from stand Lower Body Dressing Details (indicate cue type and reason): Pt unable to lean forward due to pain and requires A to bring pants over feet         Tub/ Shower Transfer: Walk-in shower;Ambulation;3 in 1 Tub/Shower Transfer Details (indicate cue type and reason): Educated pt and daughter on safe shower transfer with Min guard and Mod VCs for sequencing of task Functional mobility during ADLs: Min guard General ADL Comments: Provided pt and daughter with shoulder handout to increase carry over at home. Pt and daughter required VCs  to perform ADLs while adhering to precautions.      Vision       Perception     Praxis      Cognition Arousal/Alertness: Awake/alert Behavior During Therapy: WFL for tasks assessed/performed Overall Cognitive Status: No  family/caregiver present to determine baseline cognitive functioning Area of Impairment: Memory;Following commands;Safety/judgement                     Memory: Decreased short-term memory;Decreased recall of precautions Following Commands: Follows one step commands inconsistently;Follows one step commands with increased time Safety/Judgement: Decreased awareness of safety     General Comments: Pt has difficulty followign commands to compelte tasks and maintain precautions        Exercises Exercises: Shoulder   Shoulder Instructions Shoulder Instructions Donning/doffing shirt without moving shoulder: Moderate assistance;Caregiver independent with task Method for sponge bathing under operated UE: Moderate assistance;Caregiver independent with task Donning/doffing sling/immobilizer: Maximal assistance Correct positioning of sling/immobilizer: Maximal assistance Sling wearing schedule (on at all times/off for ADL's): Caregiver independent with task Proper positioning of operated UE when showering: Maximal assistance Positioning of UE while sleeping: Moderate assistance;Caregiver independent with task     General Comments Pt daughter arrived with clothing. Addressed all needed education to perform ADLs at home    Pertinent Vitals/ Pain       Pain Assessment: Faces Faces Pain Scale: Hurts worst Pain Location: L shoulder when leaning trunk anteriorly Pain Descriptors / Indicators: Aching;Grimacing;Guarding Pain Intervention(s): Monitored during session  Home Living                                          Prior Functioning/Environment              Frequency  Min 2X/week        Progress Toward Goals  OT Goals(current goals can now be found in the care plan section)  Progress towards OT goals: Progressing toward goals  Acute Rehab OT Goals Patient Stated Goal: to get back home and have less pain OT Goal Formulation: With patient Time For  Goal Achievement: 08/11/16 Potential to Achieve Goals: Good ADL Goals Pt Will Perform Grooming: with set-up;with supervision;standing Pt Will Perform Upper Body Dressing: with min assist;with caregiver independent in assisting;sitting Pt Will Perform Lower Body Dressing: with min assist;with caregiver independent in assisting;with adaptive equipment;sit to/from stand Pt Will Transfer to Toilet: with min guard assist;bedside commode;ambulating Pt Will Perform Toileting - Clothing Manipulation and hygiene: with min guard assist;sit to/from stand Pt Will Perform Tub/Shower Transfer: Shower transfer;3 in 1;ambulating;with min guard assist  Plan Discharge plan remains appropriate    Co-evaluation                 AM-PAC PT "6 Clicks" Daily Activity     Outcome Measure   Help from another person eating meals?: None Help from another person taking care of personal grooming?: A Little Help from another person toileting, which includes using toliet, bedpan, or urinal?: A Little Help from another person bathing (including washing, rinsing, drying)?: A Lot Help from another person to put on and taking off regular upper body clothing?: A Lot Help from another person to put on and taking off regular lower body clothing?: A Lot 6 Click Score: 16    End of Session Equipment Utilized During Treatment: Other (comment) (Sling)  OT Visit Diagnosis: Unsteadiness on feet (R26.81);Other abnormalities  of gait and mobility (R26.89);Muscle weakness (generalized) (M62.81);Pain Pain - Right/Left: Left Pain - part of body: Shoulder   Activity Tolerance Patient limited by pain   Patient Left in chair;with call bell/phone within reach   Nurse Communication Mobility status;Precautions        Time: 2423-5361 OT Time Calculation (min): 27 min  Charges: OT General Charges $OT Visit: 1 Procedure OT Treatments $Self Care/Home Management : 23-37 mins  [place],  OTR/L [phone]   [place] 07/29/2016, 5:50 PM

## 2016-07-29 NOTE — Progress Notes (Signed)
Spoke to patient at the bedside. She states she is from home alone. Spouse recently passed away. She states she removed a mattress pad from bed, left it on the floor and tripped and fell over it in the middle of the night when she got up to turn on the [facility]. She sustained a shoulder/ arm fracture. Her plan is to DC to home with [facility] and obtain cane at [facility] after Dc. She will have PT [facility] through [facility] referral placed. Patient states she is speaking with her daughter who lives locally about moving in with her after DC.

## 2016-07-29 NOTE — Consult Note (Signed)
Physical Medicine and Rehabilitation Consult Reason for Consult: Decreased functional mobility Referring Physician: [facility]   HPI: Alicia Joseph is a 83 y.o. right handed female with history of hypertension, memory loss and osteopenia. Patient lives alone recently widowed and reported to be independent prior to admission. 2 level home with bed and bath upstairs. Daughter in the area question assistance. She presented 07/27/2016 after mechanical fall landing on her left shoulder. Denied loss of consciousness. X-rays and imaging revealed displaced comminuted fracture of the left humeral neck. Orthopedic services Dr. Lyla Glassing consulted advised nonoperative management. Placed in a shoulder sling nonweightbearing left upper extremity. Hospital course pain management. Findings of mild hypokalemia of 3.3 was supplement added. Monitoring of hemoglobin 10.9. Subcutaneous Lovenox for DVT prophylaxis. Physical and occupational therapy evaluations completed. M.D. has requested physical medicine rehabilitation consult.   Review of Systems  Constitutional: Negative for chills and fever.  HENT: Negative for hearing loss.   Eyes: Negative for blurred vision and double vision.  Respiratory: Negative for cough and shortness of breath.   Cardiovascular: Negative for chest pain, palpitations and leg swelling.  Gastrointestinal: Positive for constipation. Negative for nausea and vomiting.  Genitourinary: Negative for dysuria, flank pain and hematuria.  Musculoskeletal: Positive for falls and myalgias.  Skin: Negative for rash.  Neurological: Positive for weakness. Negative for seizures.  Psychiatric/Behavioral: Positive for memory loss.  All other systems reviewed and are negative.  Past Medical History:  Diagnosis Date  . Chest pain   . FH: colonic polyps   . Hyperlipidemia   . Hypertension   . Menopause   . Osteopenia    Past Surgical History:  Procedure Laterality Date  . ABDOMINAL HYSTERECTOMY     . CHOLECYSTECTOMY     Family History  Problem Relation Age of Onset  . Cancer Sister        breast  . Cancer Father        lung  . Diabetes Brother   . Cancer Daughter        thyroid   Social History:  reports that she has never smoked. She has never used smokeless tobacco. She reports that she drinks alcohol. She reports that she does not use drugs. Allergies:  Allergies  Allergen Reactions  . Atorvastatin     REACTION: pain in feet  . Ezetimibe-Simvastatin     REACTION: pain in feet   Medications Prior to Admission  Medication Sig Dispense Refill  . aspirin EC 81 MG tablet Take 81 mg by mouth daily.    Marland Kitchen ezetimibe (ZETIA) 10 MG tablet Take 1 tablet (10 mg total) by mouth daily. 30 tablet 6  . losartan (COZAAR) 100 MG tablet Take 1 tablet (100 mg total) by mouth daily. 30 tablet 3    Home: Home Living Family/patient expects to be discharged to:: Private residence Living Arrangements: Alone Available Help at Discharge: Family, Available PRN/intermittently Type of Home: House Home Access: Stairs to enter [employer] of Steps: 2-3 Home Layout: Two level, Bed/bath upstairs, 1/2 bath on main level Alternate Level Stairs-Number of Steps: flight  Alternate Level Stairs-Rails: Left Bathroom Shower/Tub: Multimedia programmer: Standard Bathroom Accessibility: Yes Home Equipment: Environmental consultant - 2 wheels, Cane - single point, Hand held shower head  Functional History: Prior Function Level of Independence: Independent Comments: was completely independent and driving.  Functional Status:  Mobility: Bed Mobility Overal bed mobility: Needs Assistance Bed Mobility: Supine to Sit Supine to sit: Min assist General bed mobility comments:  pt OOB in chair upon arrival Transfers Overall transfer level: Needs assistance Equipment used: 1 person hand held assist Transfers: Sit to/from Stand Sit to Stand: Min assist Stand pivot transfers: Min assist General  transfer comment: assist to steady upon standing Ambulation/Gait Ambulation/Gait assistance: Min assist Ambulation Distance (Feet): 80 Feet Assistive device: Straight cane Gait Pattern/deviations: Step-through pattern, Decreased stride length, Drifts right/left General Gait Details: cues for sequencing of 3 point gait and safe use of AD; unsteady however no LOB Gait velocity: decreased Stairs: Yes Stairs assistance: Min assist Stair Management: No rails, Step to pattern, Forwards, With cane Number of Stairs: 2 General stair comments: cues for sequencing and technique; assist to steady    ADL: ADL Overall ADL's : Needs assistance/impaired Eating/Feeding: Set up, Sitting Grooming: Set up, Standing, Min guard Grooming Details (indicate cue type and reason): Unable to stand for long periods of time due decreased activity tolerance with pain Upper Body Bathing: Maximal assistance, Sitting Lower Body Bathing: Moderate assistance, Sit to/from stand Upper Body Dressing : Maximal assistance, Sitting Upper Body Dressing Details (indicate cue type and reason): Educated pt on sling positioning and donning/doffing. Pt limited by pain. Will need to educate on UB dressing at next session prior to dc Lower Body Dressing: Moderate assistance, Sit to/from stand Lower Body Dressing Details (indicate cue type and reason): Pt able to bring ankle to knee but has been ROM leaning forward due to pain Toilet Transfer: Minimal assistance, Ambulation, Grab bars (single hand held) Toilet Transfer Details (indicate cue type and reason): Pt performed transfer to [facility] with Min A.  Toileting- Clothing Manipulation and Hygiene: Minimal assistance, Sit to/from stand Toileting - Clothing Manipulation Details (indicate cue type and reason): Pt performed toilet hygiene Tub/Shower Transfer Details (indicate cue type and reason): Will need education on safe shower tf Functional mobility during ADLs: Minimal assistance  (Single hand held A) General ADL Comments: Pt has limited functional performance mainly due to pain with movement. Pt wants to go home instead instead of SNF. however, due to limitations and decreased caregiver supportshe may need short SNF stay  Cognition: Cognition Overall Cognitive Status: No family/caregiver present to determine baseline cognitive functioning Orientation Level: Oriented X4 Cognition Arousal/Alertness: Awake/alert Behavior During Therapy: WFL for tasks assessed/performed Overall Cognitive Status: No family/caregiver present to determine baseline cognitive functioning Area of Impairment: Memory, Following commands, Safety/judgement Memory: Decreased short-term memory Following Commands: Follows one step commands inconsistently, Follows one step commands with increased time Safety/Judgement: Decreased awareness of safety General Comments: pt appeared confused at times and easily distracted during tasks  Blood pressure (!) 147/62, pulse 85, temperature 98.7 F (37.1 C), temperature source Oral, resp. rate 18, SpO2 94 %. Physical Exam  Vitals reviewed. Constitutional: She is oriented to person, place, and time. She appears well-developed and well-nourished.  HENT:  Head: Normocephalic and atraumatic.  Eyes: Conjunctivae and EOM are normal.  Neck: Normal range of motion. Neck supple. No thyromegaly present.  Cardiovascular: Normal rate, regular rhythm and normal heart sounds.   Respiratory: Effort normal and breath sounds normal. No respiratory distress.  GI: Soft. Bowel sounds are normal. She exhibits no distension.  Musculoskeletal: She exhibits edema and tenderness.  Neurological: She is alert and oriented to person, place, and time.  Motor: RUE: 5/5 proximal to distal LUE: Hand grip 4/5, proximally limited by pain B/l LE: HF 4-/5, KE 4/5, ADF/PF 4+/5 Sensation intact to light touch  Skin: Skin is warm and dry.  Left upper extremity with shoulder sling  appropriately tender  Psychiatric: She has a normal mood and affect. Her behavior is normal. Thought content normal.    Results for orders placed or performed during the hospital encounter of 07/27/16 (from the past 24 hour(s))  CBC     Status: Abnormal   Collection Time: 07/29/16  5:08 AM  Result Value Ref Range   WBC 9.4 4.0 - 10.5 K/uL   RBC 3.52 (L) 3.87 - 5.11 MIL/uL   Hemoglobin 10.9 (L) 12.0 - 15.0 g/dL   HCT 32.8 (L) 36.0 - 46.0 %   MCV 93.2 78.0 - 100.0 fL   MCH 31.0 26.0 - 34.0 pg   MCHC 33.2 30.0 - 36.0 g/dL   RDW 13.6 11.5 - 15.5 %   Platelets 227 150 - 400 K/uL   Dg Chest 1 View  Result Date: 07/27/2016 CLINICAL DATA:  Patient fell this AM, left sided pain. Primary pain in left shoulder. EXAM: CHEST 1 VIEW COMPARISON:  Chest x-ray dated 05/13/2016. FINDINGS: Heart size and mediastinal contours are stable. Atherosclerotic changes noted aortic arch. Coarse lung markings bilaterally suggests some degree of chronic interstitial fibrosis. No pleural effusion or pneumothorax seen. Again noted is scoliotic deformity of the thoracolumbar spine, with associated mild degenerative change. No osseous fracture or dislocation seen. The left humeral neck fracture, appreciated on other plain films from today, is not imaged on this exam. IMPRESSION: No active disease. No evidence of pneumonia or pulmonary edema. Probable chronic interstitial lung disease. Aortic atherosclerosis. Electronically Signed   By: Franki Cabot M.D.   On: 07/27/2016 10:17   Dg Pelvis 1-2 Views  Result Date: 07/27/2016 CLINICAL DATA:  Patient fell this AM, left sided pain. Primary pain in left shoulder. EXAM: PELVIS - 1-2 VIEW COMPARISON:  None. FINDINGS: There is no evidence of pelvic fracture or diastasis. Stable appearance of the sclerotic lesion in the intratrochanteric region of the left femoral neck, compatible with benign bone island or old bone infarct. Mild degenerative change at each hip joint. Irregularity of  the left femoral head is likely related to the underlying degenerative change, possibly sequela of a chronic AVN. IMPRESSION: 1. No acute findings.  No osseous fracture or dislocation. 2. Chronic/degenerative changes at the left hip, suspected chronic AVN at the superomedial margin of the left femoral head. Electronically Signed   By: Franki Cabot M.D.   On: 07/27/2016 10:22   Dg Shoulder Right  Result Date: 07/27/2016 CLINICAL DATA:  Fall, left-sided shoulder pain. EXAM: RIGHT SHOULDER - 2+ VIEW COMPARISON:  None. FINDINGS: Displaced/comminuted fracture within the left humeral neck. There appears to be associated avulsion of the greater tuberosity which is displaced towards the overlying acromion. There is at least mild inferomedial subluxation of the humeral head, likely related to associated joint effusion. IMPRESSION: 1. Displaced/comminuted fracture of the left humeral neck. Avulsed greater tuberosity is superiorly displaced towards the overlying acromion. 2. At least mild inferomedial subluxation of the humeral head, likely related to associated joint effusion. Electronically Signed   By: Franki Cabot M.D.   On: 07/27/2016 10:15   Dg Forearm Left  Result Date: 07/27/2016 CLINICAL DATA:  Patient fell this AM, left sided pain. Primary pain in left shoulder. EXAM: LEFT FOREARM - 2 VIEW COMPARISON:  None. FINDINGS: There is no evidence of fracture or other focal bone lesions. Soft tissues are unremarkable. IMPRESSION: Negative. Electronically Signed   By: Franki Cabot M.D.   On: 07/27/2016 10:18   Dg Humerus Left  Result Date: 07/27/2016 CLINICAL DATA:  Patient fell this AM, left sided pain. Primary pain in left shoulder. EXAM: LEFT HUMERUS - 2+ VIEW COMPARISON:  None. FINDINGS: Displaced/comminuted fracture of the left humeral neck. Mid and distal portions of the left humerus appear intact and normally aligned. IMPRESSION: 1. Displaced/comminuted fracture of the left humeral neck, as described in  detail on earlier plain film report of the left shoulder. 2. Mid and distal portions of the left humerus are intact and normally aligned. Electronically Signed   By: Franki Cabot M.D.   On: 07/27/2016 10:19   Dg Femur Min 2 Views Left  Result Date: 07/27/2016 CLINICAL DATA:  Patient fell this AM, left sided pain. Primary pain in left shoulder. EXAM: LEFT FEMUR 2 VIEWS COMPARISON:  None. FINDINGS: There is no evidence of fracture or other focal bone lesions. Soft tissues are unremarkable. Atherosclerosis of the femoral and popliteal artery. IMPRESSION: No acute findings. No osseous fracture or dislocation. Atherosclerosis of the femoral and popliteal arteries. Electronically Signed   By: Franki Cabot M.D.   On: 07/27/2016 10:18    Assessment/Plan: Diagnosis: Debility Labs independently reviewed.  Records reviewed and summated above.  1. Does the need for close, 24 hr/day medical supervision in concert with the patient's rehab needs make it unreasonable for this patient to be served in a less intensive setting? Potentially 2. Co-Morbidities requiring supervision/potential complications: uncontrolled HTN (monitor and provide prns in accordance with increased physical exertion and pain), memory loss, osteopenia, pain management (Biofeedback training with therapies to help reduce reliance on opiate pain medications, monitor pain control during therapies, and sedation at rest and titrate to maximum efficacy to ensure participation and gains in therapies), hypokalemia (cont to monitor, replete as necessary), ABLA (transfuse if necessary to ensure appropriate perfusion for increased activity tolerance), hyperglycemia (likely stress-induced) 3. Due to safety, disease management, pain management and patient education, does the patient require 24 hr/day rehab nursing? Potentially 4. Does the patient require coordinated care of a physician, rehab nurse, PT (1-2 hrs/day, 5 days/week) and OT (1-2 hrs/day, 5  days/week) to address physical and functional deficits in the context of the above medical diagnosis(es)? Potentially Addressing deficits in the following areas: balance, endurance, locomotion, strength, transferring, bathing, dressing, toileting and psychosocial support 5. Can the patient actively participate in an intensive therapy program of at least 3 hrs of therapy per day at least 5 days per week? Yes 6. The potential for patient to make measurable gains while on inpatient rehab is excellent 7. Anticipated functional outcomes upon discharge from inpatient rehab are modified independent and supervision  with PT, modified independent and supervision with OT, n/a with SLP. 8. Estimated rehab length of stay to reach the above functional goals is: 5-8 days. 9. Anticipated D/C setting: Home 10. Anticipated post D/C treatments: HH therapy and Home excercise program 11. Overall Rehab/Functional Prognosis: excellent  RECOMMENDATIONS: This patient's condition is appropriate for continued rehabilitative care in the following setting: CIR Patient has agreed to participate in recommended program. Yes Note that insurance prior authorization may be required for reimbursement for recommended care.  Comment: Rehab Admissions Coordinator to follow up.  Delice Lesch, MD, Mellody Drown Cathlyn Parsons., PA-C 07/29/2016

## 2016-07-29 NOTE — Telephone Encounter (Signed)
Caller with [facility] states that pt was just discharged for hosp and that they recommend pt be seen. Caller asking for a verbal order for a nurse and  physical therapist to evaluate pt.

## 2016-07-29 NOTE — Telephone Encounter (Signed)
According to the hospital notes, she is still admitted and she is having an inpt rehab evaluation.  No orders at this time as we don't know what pt's disposition will be

## 2016-07-30 ENCOUNTER — Telehealth: Payer: Self-pay

## 2016-07-30 NOTE — Telephone Encounter (Signed)
Attempted to contact patient to complete TCM and schedule hospital follow up, voicemail not available.

## 2016-07-31 NOTE — Telephone Encounter (Signed)
Unable to reach patient at time of TCM Call. Left message for patient to return call when available.  

## 2016-07-31 NOTE — Telephone Encounter (Signed)
Transition Care Management Follow-up Telephone Call  Per Discharge Summary:  Admit date: 07/27/2016 Discharge date: 07/29/2016  Primary Care Physician:  Midge Minium, MD  Discharge Diagnoses:   . Humeral surgical neck fracture . Hyperlipidemia . Hypertension . Hypokalemia . Mechanical fall   Consults:  Orthopedics, Dr Lyla Glassing  Recommendations for Outpatient Follow-up:  1. Home health, PT, OT, home health aide, RN arranged 2. Patient recommended left shoulder sling at all times, removed the sling once daily for hygiene and axillary care, follow-up in office with orthopedics for repeat radiographs   DIET: Heart healthy diet  --   How have you been since you were released from the hospital? "I've been in exactly the same pain as when I had smashed the side of my body at 3am on Thursday."   Do you understand why you were in the hospital? yes   Do you understand the discharge instructions? yes   Where were you discharged to? Home   Items Reviewed:  Medications reviewed: yes  Allergies reviewed: yes  Dietary changes reviewed: yes, heart healthy  Referrals reviewed: yes, follow-up with ortho. Patient states she has this appointment scheduled.   Functional Questionnaire:   Activities of Daily Living (ADLs):   She states they are independent in the following: ambulation, bathing and hygiene, feeding, continence, grooming, toileting and dressing States they require assistance with the following: none, but has daughter that lives nearby and helps out as needed   Any transportation issues/concerns?: no, pt's daughter will drive her to appointments   Any patient concerns? yes, she is continuing to experience severe pain localized to area of injury. She has taken 'the stronger pain medicine' x2 doses q4h apart, with the most recent dose being right before our phone call. I assume this is hydrocodone based on her medication list and dosing instructions. She  does not know the names of her pain medicines, and cannot access the bottles at time of call. She also states she did not experience a fall, but rather a 'smash' against the floor.    Confirmed importance and date/time of follow-up visits scheduled yes  Provider Appointment booked with Dr. Annye Asa 08/05/16 @ 11:00am  Confirmed with patient if condition begins to worsen call PCP or go to the ER.  Patient was given the office number and encouraged to call back with question or concerns.  : yes

## 2016-08-01 ENCOUNTER — Telehealth: Payer: Self-pay | Admitting: *Deleted

## 2016-08-01 DIAGNOSIS — W19XXXD Unspecified fall, subsequent encounter: Secondary | ICD-10-CM | POA: Diagnosis not present

## 2016-08-01 DIAGNOSIS — S42202D Unspecified fracture of upper end of left humerus, subsequent encounter for fracture with routine healing: Secondary | ICD-10-CM | POA: Diagnosis not present

## 2016-08-01 DIAGNOSIS — R278 Other lack of coordination: Secondary | ICD-10-CM | POA: Diagnosis not present

## 2016-08-01 DIAGNOSIS — S42302D Unspecified fracture of shaft of humerus, left arm, subsequent encounter for fracture with routine healing: Secondary | ICD-10-CM | POA: Diagnosis not present

## 2016-08-01 DIAGNOSIS — S42202A Unspecified fracture of upper end of left humerus, initial encounter for closed fracture: Secondary | ICD-10-CM | POA: Diagnosis not present

## 2016-08-01 NOTE — Telephone Encounter (Signed)
Returned call to Alicia Joseph - Verbal okay given.

## 2016-08-01 NOTE — Telephone Encounter (Signed)
[facility] for medical SW order Not sure that home health will do pain management but ok for that as well

## 2016-08-01 NOTE — Telephone Encounter (Signed)
Alicia Joseph with [facility] calling wanting a verbal order for Medical Social Worker and Pain Management due to patient's fall and broken leg.   Patient has agreed to [facility] and PT ordered by the hospital, but now she is requesting pain management and social worker to help with advance directives etc.    Alicia Joseph would like a call back with approval - [phone]

## 2016-08-02 DIAGNOSIS — S42202D Unspecified fracture of upper end of left humerus, subsequent encounter for fracture with routine healing: Secondary | ICD-10-CM | POA: Diagnosis not present

## 2016-08-02 DIAGNOSIS — S42202A Unspecified fracture of upper end of left humerus, initial encounter for closed fracture: Secondary | ICD-10-CM | POA: Diagnosis not present

## 2016-08-02 DIAGNOSIS — S42302D Unspecified fracture of shaft of humerus, left arm, subsequent encounter for fracture with routine healing: Secondary | ICD-10-CM | POA: Diagnosis not present

## 2016-08-02 DIAGNOSIS — W19XXXD Unspecified fall, subsequent encounter: Secondary | ICD-10-CM | POA: Diagnosis not present

## 2016-08-02 DIAGNOSIS — R278 Other lack of coordination: Secondary | ICD-10-CM | POA: Diagnosis not present

## 2016-08-05 ENCOUNTER — Ambulatory Visit: Payer: Medicare Other | Admitting: Family Medicine

## 2016-08-05 ENCOUNTER — Other Ambulatory Visit: Payer: Self-pay | Admitting: Family Medicine

## 2016-08-05 ENCOUNTER — Telehealth: Payer: Self-pay | Admitting: Family Medicine

## 2016-08-05 DIAGNOSIS — Z0289 Encounter for other administrative examinations: Secondary | ICD-10-CM

## 2016-08-05 MED ORDER — HYDROCODONE-ACETAMINOPHEN 5-325 MG PO TABS: 1.0000 | ORAL_TABLET | ORAL | 0 refills | Status: DC | PRN

## 2016-08-05 NOTE — Progress Notes (Signed)
Pt's daughter came in today- not realizing that mom had an appt that they missed at 11:30- asking for a refill on pain medication due to mom's recent humeral fx.  Reviewed pt's pain medication hx- she received 5 days of Hydrocodone on 5/13.  Will provide 30 additional pills to improve her pain level and will see her tomorrow

## 2016-08-05 NOTE — Telephone Encounter (Signed)
Caller with Alicia Joseph states that pt does not need skilled services at this time.

## 2016-08-05 NOTE — Telephone Encounter (Signed)
FYI

## 2016-08-06 ENCOUNTER — Telehealth: Payer: Self-pay | Admitting: General Practice

## 2016-08-06 ENCOUNTER — Ambulatory Visit: Payer: Medicare Other | Admitting: Family Medicine

## 2016-08-06 DIAGNOSIS — Z0289 Encounter for other administrative examinations: Secondary | ICD-10-CM

## 2016-08-06 DIAGNOSIS — S42302D Unspecified fracture of shaft of humerus, left arm, subsequent encounter for fracture with routine healing: Secondary | ICD-10-CM | POA: Diagnosis not present

## 2016-08-06 DIAGNOSIS — W19XXXD Unspecified fall, subsequent encounter: Secondary | ICD-10-CM | POA: Diagnosis not present

## 2016-08-06 DIAGNOSIS — S42202D Unspecified fracture of upper end of left humerus, subsequent encounter for fracture with routine healing: Secondary | ICD-10-CM | POA: Diagnosis not present

## 2016-08-06 DIAGNOSIS — S42202A Unspecified fracture of upper end of left humerus, initial encounter for closed fracture: Secondary | ICD-10-CM | POA: Diagnosis not present

## 2016-08-06 DIAGNOSIS — R278 Other lack of coordination: Secondary | ICD-10-CM | POA: Diagnosis not present

## 2016-08-06 NOTE — Telephone Encounter (Signed)
Pt was 20 minutes late for appointments on 5/21 and 5/22. Unable to be seen by PCP.   Pt daughter was advised on 5/22 when she called that pt appointment was at 10:45 so that they would be on time and they arrived at 11:20 for 11 am appointment.

## 2016-08-08 ENCOUNTER — Ambulatory Visit: Payer: Medicare Other | Admitting: Family Medicine

## 2016-08-08 ENCOUNTER — Telehealth: Payer: Self-pay | Admitting: Family Medicine

## 2016-08-08 DIAGNOSIS — Z0289 Encounter for other administrative examinations: Secondary | ICD-10-CM

## 2016-08-08 NOTE — Telephone Encounter (Signed)
Received call from at 9:45am asking if we had received a message from after hours cancelling her appt for today at 11:00. When I advise her no we did not received a message, she then stated that she did not have transpiration to get here and that she would call back to reschedule at a later time.

## 2016-08-09 ENCOUNTER — Encounter (HOSPITAL_COMMUNITY): Payer: Self-pay

## 2016-08-09 ENCOUNTER — Encounter: Payer: Self-pay | Admitting: General Practice

## 2016-08-09 ENCOUNTER — Emergency Department (HOSPITAL_COMMUNITY)
Admission: EM | Admit: 2016-08-09 | Discharge: 2016-08-09 | Disposition: A | Discharge status: Home / Self Care | Payer: Medicare Other | Attending: Emergency Medicine | Admitting: Emergency Medicine

## 2016-08-09 ENCOUNTER — Emergency Department (HOSPITAL_COMMUNITY): Payer: Medicare Other

## 2016-08-09 ENCOUNTER — Telehealth: Payer: Self-pay | Admitting: Family Medicine

## 2016-08-09 DIAGNOSIS — Y929 Unspecified place or not applicable: Secondary | ICD-10-CM | POA: Insufficient documentation

## 2016-08-09 DIAGNOSIS — W1830XA Fall on same level, unspecified, initial encounter: Secondary | ICD-10-CM | POA: Insufficient documentation

## 2016-08-09 DIAGNOSIS — I10 Essential (primary) hypertension: Secondary | ICD-10-CM | POA: Insufficient documentation

## 2016-08-09 DIAGNOSIS — Y939 Activity, unspecified: Secondary | ICD-10-CM | POA: Diagnosis not present

## 2016-08-09 DIAGNOSIS — S42292A Other displaced fracture of upper end of left humerus, initial encounter for closed fracture: Secondary | ICD-10-CM | POA: Diagnosis not present

## 2016-08-09 DIAGNOSIS — S4992XA Unspecified injury of left shoulder and upper arm, initial encounter: Secondary | ICD-10-CM | POA: Diagnosis not present

## 2016-08-09 DIAGNOSIS — Z79899 Other long term (current) drug therapy: Secondary | ICD-10-CM | POA: Diagnosis not present

## 2016-08-09 DIAGNOSIS — S42202A Unspecified fracture of upper end of left humerus, initial encounter for closed fracture: Secondary | ICD-10-CM

## 2016-08-09 DIAGNOSIS — M25512 Pain in left shoulder: Secondary | ICD-10-CM | POA: Diagnosis not present

## 2016-08-09 DIAGNOSIS — Y999 Unspecified external cause status: Secondary | ICD-10-CM | POA: Diagnosis not present

## 2016-08-09 DIAGNOSIS — Z7982 Long term (current) use of aspirin: Secondary | ICD-10-CM | POA: Insufficient documentation

## 2016-08-09 DIAGNOSIS — W010XXA Fall on same level from slipping, tripping and stumbling without subsequent striking against object, initial encounter: Secondary | ICD-10-CM

## 2016-08-09 DIAGNOSIS — S8992XA Unspecified injury of left lower leg, initial encounter: Secondary | ICD-10-CM | POA: Diagnosis present

## 2016-08-09 MED ORDER — ACETAMINOPHEN 500 MG PO TABS
1000.0000 mg | ORAL_TABLET | Freq: Once | ORAL | Status: AC
  Administered 2016-08-09: 1000 mg via ORAL
  Filled 2016-08-09: qty 2

## 2016-08-09 NOTE — ED Notes (Signed)
Patient transported to X-ray 

## 2016-08-09 NOTE — ED Notes (Signed)
Pt unable to sign, denies concerns with dc

## 2016-08-09 NOTE — Telephone Encounter (Signed)
Verbal ok, carrie also advised that she is going to extend the visits to 1x a week for 2 weeks. She will have OT reevaluate and also Physical Therapy reevaluate. Social work will also evaluate next week.

## 2016-08-09 NOTE — Discharge Instructions (Signed)
It was our pleasure to provide your ER care today - we hope that you feel better.  Wear your sling.  Use great cautions to avoid falling.  Take acetaminophen and ibuprofen as need for pain.   Follow up with orthopedist in the coming week - see referral - call their office this Tuesday morning to arrange appointment.   Return to ER if worse, new symptoms, new or severe pain, other concern.

## 2016-08-09 NOTE — Telephone Encounter (Signed)
Patient dismissed from [facility] by Annye Asa MD, effective Aug 09, 2016. Dismissal letter sent out by certified / registered mail.  daj

## 2016-08-09 NOTE — Telephone Encounter (Signed)
Noted dismissal letter written, PCP signed and forwarded to manager.

## 2016-08-09 NOTE — ED Notes (Signed)
Pt is requesting information on how to obtain a life alert necklace.

## 2016-08-09 NOTE — Telephone Encounter (Signed)
Noted  

## 2016-08-09 NOTE — Telephone Encounter (Signed)
Unfortunately, this is the [facility] F/U appt she has missed just this week.  That is 90 minutes of appt time that could have been scheduled with others in need.  Based on this, she has exceeded the amount of acceptable no-shows and while it upsets me, we will have to dismiss

## 2016-08-09 NOTE — ED Triage Notes (Signed)
Pt reports she tripped on mud and landed on her bottom. She reports right arm pain and tailbone pain. She fell two weeks ago on that side and already had pain to that side but now the pain is worse. Denies head injury or LOC.

## 2016-08-09 NOTE — ED Notes (Signed)
ED Provider at bedside. 

## 2016-08-09 NOTE — Telephone Encounter (Signed)
Caller with [facility] is asking for verbal orders to continue medication management 1 x a wk for 1 wk.

## 2016-08-09 NOTE — ED Provider Notes (Signed)
[place] DEPT Provider Note   CSN: [id number] Arrival date & time: 08/09/16  1240     History   Chief Complaint Chief Complaint  Patient presents with  . Fall    HPI Alicia Joseph is a 83 y.o. female.  Patient s/p fall today. States was outside, and slipped. No head injury or loc. No faintness or dizziness prior to fall. No syncope. Notes previous fall a few weeks ago with injury to left shoulder. Patient is worried about exacerbating her prior injury. C/o pain to left shoulder. Moderate. Constant. Worse w movement. No numbness/weakness. Denies neck or back pain. Denies other pain or injury.    The history is provided by the patient.  Fall  Pertinent negatives include no chest pain, no abdominal pain, no headaches and no shortness of breath.    Past Medical History:  Diagnosis Date  . Chest pain   . FH: colonic polyps   . Hyperlipidemia   . Hypertension   . Menopause   . Osteopenia     Patient Active Problem List   Diagnosis Date Noted  . Left arm pain   . Acute blood loss anemia   . Humeral surgical neck fracture 07/27/2016  . Shoulder pain, acute 07/27/2016  . Shoulder fracture 07/27/2016  . Hypokalemia 07/27/2016  . Memory loss 07/17/2016  . Glenoid fracture of shoulder, left, closed, initial encounter 02/22/2016  . Broken nose 06/23/2013  . Facial laceration 06/23/2013  . Fall at home 06/23/2013  . Benign paroxysmal positional vertigo 01/25/2013  . Muscle spasm 08/27/2012  . Rib pain 12/04/2011  . Hypertension   . General medical examination 03/08/2011  . Hyperglycemia 03/08/2011  . Foot pain 10/23/2010  . UNSPECIFIED SLEEP APNEA 11/13/2009  . Anxiety state 11/10/2009  . HYPOXEMIA 11/10/2009  . BACK PAIN 02/15/2008  . Hyperlipidemia 01/14/2007  . MENOPAUSE, SURGICAL 01/14/2007  . Osteopenia 01/14/2007    Past Surgical History:  Procedure Laterality Date  . ABDOMINAL HYSTERECTOMY    . CHOLECYSTECTOMY      OB History    No data available         Home Medications    Prior to Admission medications   Medication Sig Start Date End Date Taking? Authorizing Provider  acetaminophen (TYLENOL) 325 MG tablet Take 2 tablets (650 mg total) by mouth every 6 (six) hours as needed for mild pain or moderate pain. 07/28/16   Rai, Vernelle Emerald, MD  aspirin EC 81 MG tablet Take 81 mg by mouth daily.    [provider]  ezetimibe (ZETIA) 10 MG tablet Take 1 tablet (10 mg total) by mouth daily. 07/17/16   Midge Minium, MD  HYDROcodone-acetaminophen (NORCO/VICODIN) 5-325 MG tablet Take 1 tablet by mouth every 4 (four) hours as needed for severe pain. 08/05/16   Midge Minium, MD  losartan (COZAAR) 100 MG tablet Take 1 tablet (100 mg total) by mouth daily. 07/17/16   Midge Minium, MD  polyethylene glycol (MIRALAX / Floria Raveling) packet Take 17 g by mouth daily as needed for moderate constipation. 07/28/16   Rai, Ripudeep K, MD  senna-docusate (SENOKOT-S) 8.6-50 MG tablet Take 1 tablet by mouth 2 (two) times daily. For constipation 07/28/16   Mendel Corning, MD    Family History Family History  Problem Relation Age of Onset  . Cancer Sister        breast  . Cancer Father        lung  . Diabetes Brother   . Cancer  Daughter        thyroid    Social History Social History  Substance Use Topics  . Smoking status: Never Smoker  . Smokeless tobacco: Never Used  . Alcohol use Yes     Allergies   Atorvastatin and Ezetimibe-simvastatin   Review of Systems Review of Systems  Constitutional: Negative for fever.  HENT: Negative for sore throat.   Eyes: Negative for visual disturbance.  Respiratory: Negative for shortness of breath.   Cardiovascular: Negative for chest pain.  Gastrointestinal: Negative for abdominal pain.  Genitourinary: Negative for flank pain.  Musculoskeletal: Negative for back pain and neck pain.  Skin: Negative for rash.  Neurological: Negative for headaches.  Hematological: Does not bruise/bleed  easily.  Psychiatric/Behavioral: Negative for confusion.     Physical Exam Updated Vital Signs BP (!) 192/82 (BP Location: Right Arm)   Pulse 72   Temp 98.4 F (36.9 C) (Oral)   Resp 16   Physical Exam  Constitutional: She is oriented to person, place, and time. She appears well-developed and well-nourished. No distress.  HENT:  Head: Atraumatic.  Eyes: Pupils are equal, round, and reactive to light. No scleral icterus.  Neck: Neck supple. No tracheal deviation present.  Cardiovascular: Normal rate, regular rhythm, normal heart sounds and intact distal pulses.   Pulmonary/Chest: Effort normal and breath sounds normal. No respiratory distress. She exhibits no tenderness.  Abdominal: Soft. Normal appearance. She exhibits no distension. There is no tenderness.  Musculoskeletal: She exhibits no edema.  CTLS spine, non tender, aligned, no step off. Tenderness to left shoulder. Old bruising noted to upper arm. Radial pulse 2+. No other focal bony tenderness on bilateral extremity exam.   Neurological: She is alert and oriented to person, place, and time.  Motor intact bil. sens grossly intact.   Skin: Skin is warm and dry. No rash noted. She is not diaphoretic.  Psychiatric: She has a normal mood and affect.  Nursing note and vitals reviewed.    ED Treatments / Results  Labs (all labs ordered are listed, but only abnormal results are displayed) Labs Reviewed - No data to display  EKG  EKG Interpretation None       Radiology Dg Shoulder Left  Result Date: 08/09/2016 CLINICAL DATA:  83 year old who fell at home approximately 2 weeks ago and complains of generalized left upper extremity pain and bruising. Subsequent encounter. EXAM: LEFT SHOULDER - 2+ VIEW COMPARISON:  Left humerus x-ray 07/27/2016. FINDINGS: Multipart comminuted fracture involving the humeral head and neck as demonstrated on the prior examination. Joint effusion/hemarthrosis again noted. No new fractures.  Acromioclavicular joint intact with well-preserved joint space. Osseous demineralization. IMPRESSION: Multipart comminuted fracture involving the humeral head neck as demonstrated previously without significant change. No new fractures. Electronically Signed   By: Evangeline Dakin M.D.   On: 08/09/2016 17:14    Procedures Procedures (including critical care time)  Medications Ordered in ED Medications - No data to display   Initial Impression / Assessment and Plan / ED Course  I have reviewed the triage vital signs and the nursing notes.  Pertinent labs & imaging results that were available during my care of the patient were reviewed by me and considered in my medical decision making (see chart for details).  xrays discussed w pt.   Sling, need for ortho f/u and PT discussed.   Pain improved w meds.     Final Clinical Impressions(s) / ED Diagnoses   Final diagnoses:  None    New  Prescriptions New Prescriptions   No medications on file     Lajean Saver, MD 08/10/16 1537

## 2016-08-13 DIAGNOSIS — R278 Other lack of coordination: Secondary | ICD-10-CM | POA: Diagnosis not present

## 2016-08-13 DIAGNOSIS — S42302D Unspecified fracture of shaft of humerus, left arm, subsequent encounter for fracture with routine healing: Secondary | ICD-10-CM | POA: Diagnosis not present

## 2016-08-13 DIAGNOSIS — S42202D Unspecified fracture of upper end of left humerus, subsequent encounter for fracture with routine healing: Secondary | ICD-10-CM | POA: Diagnosis not present

## 2016-08-13 DIAGNOSIS — W19XXXD Unspecified fall, subsequent encounter: Secondary | ICD-10-CM | POA: Diagnosis not present

## 2016-08-13 DIAGNOSIS — S42202A Unspecified fracture of upper end of left humerus, initial encounter for closed fracture: Secondary | ICD-10-CM | POA: Diagnosis not present

## 2016-08-14 DIAGNOSIS — S42202A Unspecified fracture of upper end of left humerus, initial encounter for closed fracture: Secondary | ICD-10-CM | POA: Diagnosis not present

## 2016-08-14 DIAGNOSIS — S42302D Unspecified fracture of shaft of humerus, left arm, subsequent encounter for fracture with routine healing: Secondary | ICD-10-CM | POA: Diagnosis not present

## 2016-08-14 DIAGNOSIS — R278 Other lack of coordination: Secondary | ICD-10-CM | POA: Diagnosis not present

## 2016-08-14 DIAGNOSIS — S42202D Unspecified fracture of upper end of left humerus, subsequent encounter for fracture with routine healing: Secondary | ICD-10-CM | POA: Diagnosis not present

## 2016-08-14 DIAGNOSIS — W19XXXD Unspecified fall, subsequent encounter: Secondary | ICD-10-CM | POA: Diagnosis not present

## 2016-08-15 DIAGNOSIS — S42202A Unspecified fracture of upper end of left humerus, initial encounter for closed fracture: Secondary | ICD-10-CM | POA: Diagnosis not present

## 2016-08-15 DIAGNOSIS — S42202D Unspecified fracture of upper end of left humerus, subsequent encounter for fracture with routine healing: Secondary | ICD-10-CM | POA: Diagnosis not present

## 2016-08-15 DIAGNOSIS — S42302D Unspecified fracture of shaft of humerus, left arm, subsequent encounter for fracture with routine healing: Secondary | ICD-10-CM | POA: Diagnosis not present

## 2016-08-15 DIAGNOSIS — W19XXXD Unspecified fall, subsequent encounter: Secondary | ICD-10-CM | POA: Diagnosis not present

## 2016-08-15 DIAGNOSIS — R278 Other lack of coordination: Secondary | ICD-10-CM | POA: Diagnosis not present

## 2016-08-16 ENCOUNTER — Telehealth: Payer: Self-pay | Admitting: Family Medicine

## 2016-08-16 DIAGNOSIS — R413 Other amnesia: Secondary | ICD-10-CM | POA: Diagnosis not present

## 2016-08-16 DIAGNOSIS — S42292D Other displaced fracture of upper end of left humerus, subsequent encounter for fracture with routine healing: Secondary | ICD-10-CM | POA: Diagnosis not present

## 2016-08-16 DIAGNOSIS — I10 Essential (primary) hypertension: Secondary | ICD-10-CM | POA: Diagnosis not present

## 2016-08-16 DIAGNOSIS — W19XXXD Unspecified fall, subsequent encounter: Secondary | ICD-10-CM | POA: Diagnosis not present

## 2016-08-16 DIAGNOSIS — Z9181 History of falling: Secondary | ICD-10-CM | POA: Diagnosis not present

## 2016-08-16 DIAGNOSIS — Z7982 Long term (current) use of aspirin: Secondary | ICD-10-CM | POA: Diagnosis not present

## 2016-08-16 DIAGNOSIS — E785 Hyperlipidemia, unspecified: Secondary | ICD-10-CM | POA: Diagnosis not present

## 2016-08-16 DIAGNOSIS — M8580 Other specified disorders of bone density and structure, unspecified site: Secondary | ICD-10-CM | POA: Diagnosis not present

## 2016-08-16 NOTE — Telephone Encounter (Signed)
Verbal ok given.

## 2016-08-16 NOTE — Telephone Encounter (Signed)
Ok to proceed. 

## 2016-08-16 NOTE — Telephone Encounter (Signed)
Alicia Joseph with [facility] requesting verbal order for OT for:  1 time a week for 2 weeks 2 time a week for 4 weeks   Please call Alicia Joseph back at [phone] with orders.

## 2016-08-16 NOTE — Telephone Encounter (Signed)
Please advise 

## 2016-08-16 NOTE — Telephone Encounter (Signed)
Called to give the verbal ok, voicemail was full and could not leave a message.

## 2016-08-19 NOTE — Telephone Encounter (Signed)
Received signed domestic return receipt verifying delivery of certified letter on Aug 15, 2016. Article number [id number] [id number]

## 2016-08-28 ENCOUNTER — Ambulatory Visit: Payer: Medicare Other | Admitting: Family Medicine

## 2016-08-28 DIAGNOSIS — Z0289 Encounter for other administrative examinations: Secondary | ICD-10-CM

## 2016-08-29 DIAGNOSIS — S42292D Other displaced fracture of upper end of left humerus, subsequent encounter for fracture with routine healing: Secondary | ICD-10-CM | POA: Diagnosis not present

## 2016-09-13 DIAGNOSIS — S42292D Other displaced fracture of upper end of left humerus, subsequent encounter for fracture with routine healing: Secondary | ICD-10-CM | POA: Diagnosis not present

## 2017-04-05 DIAGNOSIS — M533 Sacrococcygeal disorders, not elsewhere classified: Secondary | ICD-10-CM | POA: Diagnosis not present

## 2017-04-05 DIAGNOSIS — W1830XA Fall on same level, unspecified, initial encounter: Secondary | ICD-10-CM | POA: Diagnosis not present

## 2017-04-05 DIAGNOSIS — S32511A Fracture of superior rim of right pubis, initial encounter for closed fracture: Secondary | ICD-10-CM | POA: Diagnosis not present

## 2017-04-10 DIAGNOSIS — M533 Sacrococcygeal disorders, not elsewhere classified: Secondary | ICD-10-CM | POA: Diagnosis not present

## 2017-04-10 DIAGNOSIS — S32501A Unspecified fracture of right pubis, initial encounter for closed fracture: Secondary | ICD-10-CM | POA: Diagnosis not present

## 2017-04-10 DIAGNOSIS — W1830XA Fall on same level, unspecified, initial encounter: Secondary | ICD-10-CM | POA: Diagnosis not present

## 2017-04-10 DIAGNOSIS — R296 Repeated falls: Secondary | ICD-10-CM | POA: Diagnosis not present

## 2017-04-14 DIAGNOSIS — R269 Unspecified abnormalities of gait and mobility: Secondary | ICD-10-CM | POA: Diagnosis not present

## 2017-04-14 DIAGNOSIS — S32501A Unspecified fracture of right pubis, initial encounter for closed fracture: Secondary | ICD-10-CM | POA: Diagnosis not present

## 2017-04-14 DIAGNOSIS — M8448XD Pathological fracture, other site, subsequent encounter for fracture with routine healing: Secondary | ICD-10-CM | POA: Diagnosis not present

## 2017-04-17 ENCOUNTER — Other Ambulatory Visit: Payer: Self-pay

## 2017-04-17 ENCOUNTER — Encounter (HOSPITAL_COMMUNITY): Payer: Self-pay | Admitting: Emergency Medicine

## 2017-04-17 ENCOUNTER — Emergency Department (HOSPITAL_COMMUNITY)
Admission: EM | Admit: 2017-04-17 | Discharge: 2017-04-17 | Disposition: A | Discharge status: Home / Self Care | Payer: Medicare Other | Attending: Emergency Medicine | Admitting: Emergency Medicine

## 2017-04-17 DIAGNOSIS — Z79899 Other long term (current) drug therapy: Secondary | ICD-10-CM | POA: Diagnosis not present

## 2017-04-17 DIAGNOSIS — I159 Secondary hypertension, unspecified: Secondary | ICD-10-CM | POA: Diagnosis not present

## 2017-04-17 DIAGNOSIS — I10 Essential (primary) hypertension: Secondary | ICD-10-CM | POA: Insufficient documentation

## 2017-04-17 DIAGNOSIS — Z7982 Long term (current) use of aspirin: Secondary | ICD-10-CM | POA: Diagnosis not present

## 2017-04-17 LAB — CBC WITH DIFFERENTIAL/PLATELET
Basophils Absolute: 0 10*3/uL (ref 0.0–0.1)
Basophils Relative: 0 %
EOS PCT: 3 %
Eosinophils Absolute: 0.2 10*3/uL (ref 0.0–0.7)
HCT: 35.8 % — ABNORMAL LOW (ref 36.0–46.0)
Hemoglobin: 11.8 g/dL — ABNORMAL LOW (ref 12.0–15.0)
LYMPHS ABS: 1.1 10*3/uL (ref 0.7–4.0)
LYMPHS PCT: 12 %
MCH: 30.1 pg (ref 26.0–34.0)
MCHC: 33 g/dL (ref 30.0–36.0)
MCV: 91.3 fL (ref 78.0–100.0)
Monocytes Absolute: 0.7 10*3/uL (ref 0.1–1.0)
Monocytes Relative: 8 %
Neutro Abs: 6.8 10*3/uL (ref 1.7–7.7)
Neutrophils Relative %: 77 %
Platelets: 291 10*3/uL (ref 150–400)
RBC: 3.92 MIL/uL (ref 3.87–5.11)
RDW: 13.3 % (ref 11.5–15.5)
WBC: 8.9 10*3/uL (ref 4.0–10.5)

## 2017-04-17 LAB — BASIC METABOLIC PANEL
Anion gap: 11 (ref 5–15)
BUN: 8 mg/dL (ref 6–20)
CO2: 25 mmol/L (ref 22–32)
Calcium: 9.2 mg/dL (ref 8.9–10.3)
Chloride: 101 mmol/L (ref 101–111)
Creatinine, Ser: 0.58 mg/dL (ref 0.44–1.00)
GFR calc Af Amer: >60 mL/min (ref >60)
GFR calc non Af Amer: >60 mL/min (ref >60)
Glucose, Bld: 102 mg/dL — ABNORMAL HIGH (ref 65–99)
Potassium: 3.8 mmol/L (ref 3.5–5.1)
Sodium: 137 mmol/L (ref 135–145)

## 2017-04-17 MED ORDER — LOSARTAN POTASSIUM 50 MG PO TABS
100.0000 mg | ORAL_TABLET | Freq: Once | ORAL | Status: AC
  Administered 2017-04-17: 100 mg via ORAL
  Filled 2017-04-17 (×2): qty 2

## 2017-04-17 MED ORDER — ACETAMINOPHEN 500 MG PO CAPS: 1.0000 | ORAL_CAPSULE | Freq: Three times a day (TID) | ORAL | 0 refills | Status: DC | PRN

## 2017-04-17 MED ORDER — HYDROCODONE-ACETAMINOPHEN 5-325 MG PO TABS
1.0000 | ORAL_TABLET | Freq: Once | ORAL | Status: AC
  Administered 2017-04-17: 1 via ORAL
  Filled 2017-04-17: qty 1

## 2017-04-17 MED ORDER — LOSARTAN POTASSIUM 100 MG PO TABS: 100.0000 mg | ORAL_TABLET | Freq: Every day | ORAL | 1 refills | Status: DC

## 2017-04-17 NOTE — Discharge Instructions (Signed)
Reduce salt from your diet. Start taking losartan as prescribed. Take tylenol for pain. Follow up with family doctor for further evaluation and treatment

## 2017-04-17 NOTE — ED Triage Notes (Signed)
Pt presents to have her blood pressure re-checked. She reports that it was high two days ago after a fall-she was treated at [facility]. She is currently out of her Losartan. BP at triage 211/88. NAD. Denies chest pain, sob or weakness.

## 2017-04-17 NOTE — ED Provider Notes (Signed)
[facility] EMERGENCY DEPARTMENT Provider Note   CSN: [id number] Arrival date & time: 04/17/17  1000     History   Chief Complaint Chief Complaint  Patient presents with  . Hypertension    HPI Alicia Joseph is a 84 y.o. female.  HPI Alicia Joseph is a 84 y.o. female with history of hypertension, presents to emergency department complaining of elevated blood pressure.  Patient states that her family doctor dismissed her approximately a year ago.  She states she has not had any blood pressure medications in about 9 months.  She states that recently she had a fall and broke her sacrum/coccyx.  She was seen by orthopedic specialist and was told that her blood pressure was very high.  It is unclear, and I was not able to find in the records, but patient states that they may have given her something for her blood pressure for 5 days which she took but ran out of medications.  She states yesterday when they took her blood pressure at home was 626 systolic and she was told to come to emergency department.  Patient denies any associated complaints.  She states she still has some pain in her sacrum area, however denies any headaches, no chest pain or shortness of breath, no swelling in extremities, no changes in vision.  She states she is urinating normally.  Patient states she currently does not have a PCP.  Past Medical History:  Diagnosis Date  . Chest pain   . FH: colonic polyps   . Hyperlipidemia   . Hypertension   . Menopause   . Osteopenia     Patient Active Problem List   Diagnosis Date Noted  . Left arm pain   . Acute blood loss anemia   . Humeral surgical neck fracture 07/27/2016  . Shoulder pain, acute 07/27/2016  . Shoulder fracture 07/27/2016  . Hypokalemia 07/27/2016  . Memory loss 07/17/2016  . Glenoid fracture of shoulder, left, closed, initial encounter 02/22/2016  . Broken nose 06/23/2013  . Facial laceration 06/23/2013  . Fall at home 06/23/2013    . Benign paroxysmal positional vertigo 01/25/2013  . Muscle spasm 08/27/2012  . Rib pain 12/04/2011  . Hypertension   . General medical examination 03/08/2011  . Hyperglycemia 03/08/2011  . Foot pain 10/23/2010  . UNSPECIFIED SLEEP APNEA 11/13/2009  . Anxiety state 11/10/2009  . HYPOXEMIA 11/10/2009  . BACK PAIN 02/15/2008  . Hyperlipidemia 01/14/2007  . MENOPAUSE, SURGICAL 01/14/2007  . Osteopenia 01/14/2007    Past Surgical History:  Procedure Laterality Date  . ABDOMINAL HYSTERECTOMY    . CHOLECYSTECTOMY      OB History    No data available       Home Medications    Prior to Admission medications   Medication Sig Start Date End Date Taking? Authorizing Provider  acetaminophen (TYLENOL) 325 MG tablet Take 2 tablets (650 mg total) by mouth every 6 (six) hours as needed for mild pain or moderate pain. 07/28/16   Rai, Vernelle Emerald, MD  aspirin EC 81 MG tablet Take 81 mg by mouth daily.    [provider]  ezetimibe (ZETIA) 10 MG tablet Take 1 tablet (10 mg total) by mouth daily. 07/17/16   Midge Minium, MD  HYDROcodone-acetaminophen (NORCO/VICODIN) 5-325 MG tablet Take 1 tablet by mouth every 4 (four) hours as needed for severe pain. 08/05/16   Midge Minium, MD  losartan (COZAAR) 100 MG tablet Take 1 tablet (100 mg  total) by mouth daily. 07/17/16   Midge Minium, MD  polyethylene glycol (MIRALAX / Floria Raveling) packet Take 17 g by mouth daily as needed for moderate constipation. 07/28/16   Rai, Ripudeep K, MD  senna-docusate (SENOKOT-S) 8.6-50 MG tablet Take 1 tablet by mouth 2 (two) times daily. For constipation 07/28/16   Mendel Corning, MD    Family History Family History  Problem Relation Age of Onset  . Cancer Sister        breast  . Cancer Father        lung  . Diabetes Brother   . Cancer Daughter        thyroid    Social History Social History   Tobacco Use  . Smoking status: Never Smoker  . Smokeless tobacco: Never Used  Substance  Use Topics  . Alcohol use: Yes  . Drug use: No     Allergies   Atorvastatin and Ezetimibe-simvastatin   Review of Systems Review of Systems  Constitutional: Negative for chills and fever.  Eyes: Negative for visual disturbance.  Respiratory: Negative for cough, chest tightness and shortness of breath.   Cardiovascular: Negative for chest pain, palpitations and leg swelling.  Gastrointestinal: Negative for abdominal pain, diarrhea, nausea and vomiting.  Genitourinary: Negative for dysuria, flank pain and pelvic pain.  Musculoskeletal: Negative for arthralgias, myalgias, neck pain and neck stiffness.  Skin: Negative for rash.  Neurological: Negative for dizziness, weakness, light-headedness and headaches.  All other systems reviewed and are negative.    Physical Exam Updated Vital Signs BP (!) 193/103 (BP Location: Right Arm)   Pulse 68   Temp 98.2 F (36.8 C) (Oral)   Resp 18   Ht 5\' 2"  (1.575 m)   SpO2 97%   BMI 24.60 kg/m   Physical Exam  Constitutional: She is oriented to person, place, and time. She appears well-developed and well-nourished. No distress.  HENT:  Head: Normocephalic.  Eyes: Conjunctivae and EOM are normal. Pupils are equal, round, and reactive to light.  Neck: Neck supple.  Cardiovascular: Normal rate, regular rhythm and normal heart sounds.  Pulmonary/Chest: Effort normal and breath sounds normal. No respiratory distress. She has no wheezes. She has no rales.  Abdominal: Soft. Bowel sounds are normal. She exhibits no distension. There is no tenderness. There is no rebound.  Musculoskeletal: She exhibits no edema.  Neurological: She is alert and oriented to person, place, and time.  5/5 and equal upper and lower extremity strength bilaterally. Equal grip strength bilaterally. Normal finger to nose and heel to shin. No pronator drift.   Skin: Skin is warm and dry.  Psychiatric: She has a normal mood and affect. Her behavior is normal.  Nursing note  and vitals reviewed.    ED Treatments / Results  Labs (all labs ordered are listed, but only abnormal results are displayed) Labs Reviewed  BASIC METABOLIC PANEL - Abnormal; Notable for the following components:      Result Value   Glucose, Bld 102 (*)    All other components within normal limits  CBC WITH DIFFERENTIAL/PLATELET - Abnormal; Notable for the following components:   Hemoglobin 11.8 (*)    HCT 35.8 (*)    All other components within normal limits    EKG  EKG Interpretation None       Radiology No results found.  Procedures Procedures (including critical care time)  Medications Ordered in ED Medications  losartan (COZAAR) tablet 100 mg (not administered)     Initial Impression /  Assessment and Plan / ED Course  I have reviewed the triage vital signs and the nursing notes.  Pertinent labs & imaging results that were available during my care of the patient were reviewed by me and considered in my medical decision making (see chart for details).     Pt in ED with elevated BP. Has been off of her BP medications for over a year. Initial blood pressure 211/88. Repeat 193/103. Pt in no acute distress.  Denies any headache, chest pain, shortness of breath, changes in vision, nausea, vomiting, dizziness.  Records reviewed, patient has been on losartan in the past.  I will order her regular dose here.  Will monitor reassess her blood pressure.  Will also get EKG.  Blood work obtained in triage, including CBC and basic metabolic panel, all unremarkable.  Discussed with Dr. Leonette Monarch who has seen patient as well.  Will restart her regular blood pressure medication, losartan.  First dose given in ED.  She is currently asymptomatic.  She is stable for discharge home.  We will have her follow-up with a family doctor.  Return precautions discussed.  Vitals:   04/17/17 1024 04/17/17 1118 04/17/17 1219 04/17/17 1311  BP: (!) 211/88 (!) 193/103 (!) 206/107 (!) 214/101  Pulse:  72 68 68   Resp: 18 18 16    Temp: 98.2 F (36.8 C)     TempSrc: Oral     SpO2: 97% 97% 98%   Height: 5\' 2"  (1.575 m)          Final Clinical Impressions(s) / ED Diagnoses   Final diagnoses:  Secondary hypertension    ED Discharge Orders        Ordered    Acetaminophen 500 MG coapsule  Every 8 hours PRN     04/17/17 1318    losartan (COZAAR) 100 MG tablet  Daily     04/17/17 1318       Jeannett Senior, PA-C 04/17/17 1635    Fatima Blank, MD 04/19/17 [phone]

## 2017-05-13 ENCOUNTER — Emergency Department (HOSPITAL_COMMUNITY)
Admission: EM | Admit: 2017-05-13 | Discharge: 2017-05-13 | Disposition: A | Discharge status: Home / Self Care | Payer: Medicare Other | Attending: Emergency Medicine | Admitting: Emergency Medicine

## 2017-05-13 DIAGNOSIS — Z760 Encounter for issue of repeat prescription: Secondary | ICD-10-CM

## 2017-05-13 DIAGNOSIS — I10 Essential (primary) hypertension: Secondary | ICD-10-CM | POA: Diagnosis not present

## 2017-05-13 DIAGNOSIS — Z79899 Other long term (current) drug therapy: Secondary | ICD-10-CM | POA: Diagnosis not present

## 2017-05-13 LAB — CBC WITH DIFFERENTIAL/PLATELET
Basophils Absolute: 0 10*3/uL (ref 0.0–0.1)
Basophils Relative: 0 %
Eosinophils Absolute: 0.3 10*3/uL (ref 0.0–0.7)
Eosinophils Relative: 3 %
HCT: 35.4 % — ABNORMAL LOW (ref 36.0–46.0)
Hemoglobin: 11.6 g/dL — ABNORMAL LOW (ref 12.0–15.0)
Lymphocytes Relative: 23 %
Lymphs Abs: 2.3 10*3/uL (ref 0.7–4.0)
MCH: 30 pg (ref 26.0–34.0)
MCHC: 32.8 g/dL (ref 30.0–36.0)
MCV: 91.5 fL (ref 78.0–100.0)
Monocytes Absolute: 0.7 10*3/uL (ref 0.1–1.0)
Monocytes Relative: 7 %
Neutro Abs: 6.6 10*3/uL (ref 1.7–7.7)
Neutrophils Relative %: 67 %
Platelets: 281 10*3/uL (ref 150–400)
RBC: 3.87 MIL/uL (ref 3.87–5.11)
RDW: 14.4 % (ref 11.5–15.5)
WBC: 9.9 10*3/uL (ref 4.0–10.5)

## 2017-05-13 LAB — BASIC METABOLIC PANEL
Anion gap: 11 (ref 5–15)
BUN: 11 mg/dL (ref 6–20)
CO2: 25 mmol/L (ref 22–32)
Calcium: 9 mg/dL (ref 8.9–10.3)
Chloride: 97 mmol/L — ABNORMAL LOW (ref 101–111)
Creatinine, Ser: 0.58 mg/dL (ref 0.44–1.00)
GFR calc Af Amer: >60 mL/min (ref >60)
GFR calc non Af Amer: >60 mL/min (ref >60)
Glucose, Bld: 98 mg/dL (ref 65–99)
Potassium: 3.6 mmol/L (ref 3.5–5.1)
Sodium: 133 mmol/L — ABNORMAL LOW (ref 135–145)

## 2017-05-13 MED ORDER — LOSARTAN POTASSIUM 100 MG PO TABS: 100.0000 mg | ORAL_TABLET | Freq: Every day | ORAL | 1 refills | Status: DC

## 2017-05-13 NOTE — ED Provider Notes (Signed)
[facility] EMERGENCY DEPARTMENT Provider Note   CSN: [id number] Arrival date & time: 05/13/17  1743     History   Chief Complaint Chief Complaint  Patient presents with  . Medication Refill    HPI Alicia Joseph is a 84 y.o. female.  HPI   Alicia Joseph is a 84 year old female with a history of hypertension, hyperlipidemia, osteopenia who presents to the emergency department for losartan refill.  She was seen here a month ago for evaluation of high blood pressure and was discharged with a one-month supply of 100 mg losartan daily.  She took her last tablet this morning.  Has not been able to establish care with a PCP given she lost her paperwork that she was discharged with last visit.  Otherwise states she feels well. Denies headache, visual loss, chest pain, trouble breathing, abdominal pain, decreased urinary output.   Past Medical History:  Diagnosis Date  . Chest pain   . FH: colonic polyps   . Hyperlipidemia   . Hypertension   . Menopause   . Osteopenia     Patient Active Problem List   Diagnosis Date Noted  . Left arm pain   . Acute blood loss anemia   . Humeral surgical neck fracture 07/27/2016  . Shoulder pain, acute 07/27/2016  . Shoulder fracture 07/27/2016  . Hypokalemia 07/27/2016  . Memory loss 07/17/2016  . Glenoid fracture of shoulder, left, closed, initial encounter 02/22/2016  . Broken nose 06/23/2013  . Facial laceration 06/23/2013  . Fall at home 06/23/2013  . Benign paroxysmal positional vertigo 01/25/2013  . Muscle spasm 08/27/2012  . Rib pain 12/04/2011  . Hypertension   . General medical examination 03/08/2011  . Hyperglycemia 03/08/2011  . Foot pain 10/23/2010  . UNSPECIFIED SLEEP APNEA 11/13/2009  . Anxiety state 11/10/2009  . HYPOXEMIA 11/10/2009  . BACK PAIN 02/15/2008  . Hyperlipidemia 01/14/2007  . MENOPAUSE, SURGICAL 01/14/2007  . Osteopenia 01/14/2007    Past Surgical History:  Procedure Laterality Date  .  ABDOMINAL HYSTERECTOMY    . CHOLECYSTECTOMY      OB History    No data available       Home Medications    Prior to Admission medications   Medication Sig Start Date End Date Taking? Authorizing Provider  losartan (COZAAR) 100 MG tablet Take 1 tablet (100 mg total) by mouth daily. 04/17/17  Yes Kirichenko, Tatyana, PA-C  acetaminophen (TYLENOL) 325 MG tablet Take 2 tablets (650 mg total) by mouth every 6 (six) hours as needed for mild pain or moderate pain. Patient not taking: Reported on 04/17/2017 07/28/16   Rai, Vernelle Emerald, MD  Acetaminophen 500 MG coapsule Take 1-2 capsules (500-1,000 mg total) by mouth every 8 (eight) hours as needed for fever. Patient not taking: Reported on 05/13/2017 04/17/17   Jeannett Senior, PA-C  ezetimibe (ZETIA) 10 MG tablet Take 1 tablet (10 mg total) by mouth daily. Patient not taking: Reported on 04/17/2017 07/17/16   Midge Minium, MD  HYDROcodone-acetaminophen (NORCO/VICODIN) 5-325 MG tablet Take 1 tablet by mouth every 4 (four) hours as needed for severe pain. Patient not taking: Reported on 05/13/2017 08/05/16   Midge Minium, MD  losartan (COZAAR) 100 MG tablet Take 1 tablet (100 mg total) by mouth daily. Patient not taking: Reported on 04/17/2017 07/17/16   Midge Minium, MD  polyethylene glycol [facility] / Floria Raveling) packet Take 17 g by mouth daily as needed for moderate constipation. Patient not taking: Reported on 04/17/2017  07/28/16   Rai, Ripudeep K, MD  senna-docusate (SENOKOT-S) 8.6-50 MG tablet Take 1 tablet by mouth 2 (two) times daily. For constipation Patient not taking: Reported on 04/17/2017 07/28/16   Mendel Corning, MD    Family History Family History  Problem Relation Age of Onset  . Cancer Sister        breast  . Cancer Father        lung  . Diabetes Brother   . Cancer Daughter        thyroid    Social History Social History   Tobacco Use  . Smoking status: Never Smoker  . Smokeless tobacco: Never Used    Substance Use Topics  . Alcohol use: Yes  . Drug use: No     Allergies   Atorvastatin and Ezetimibe-simvastatin   Review of Systems Review of Systems  Constitutional: Negative for chills and fever.  HENT: Negative for congestion and sore throat.   Eyes: Negative for visual disturbance.  Respiratory: Negative for shortness of breath.   Cardiovascular: Negative for chest pain.  Gastrointestinal: Negative for abdominal pain, nausea and vomiting.  Genitourinary: Negative for difficulty urinating, dysuria and frequency.  Musculoskeletal: Negative for gait problem.  Skin: Negative for rash.  Neurological: Negative for weakness, numbness and headaches.  Psychiatric/Behavioral: Negative for agitation.     Physical Exam Updated Vital Signs BP (!) 172/84   Pulse 66   Temp 98 F (36.7 C) (Oral)   Resp 16   SpO2 100%   Physical Exam  Constitutional: She is oriented to person, place, and time. She appears well-developed and well-nourished. No distress.  HENT:  Head: Normocephalic and atraumatic.  Mouth/Throat: Oropharynx is clear and moist. No oropharyngeal exudate.  Eyes: Conjunctivae are normal. Pupils are equal, round, and reactive to light. Right eye exhibits no discharge. Left eye exhibits no discharge.  Neck: Normal range of motion. Neck supple.  Cardiovascular: Normal rate, regular rhythm and intact distal pulses. Exam reveals no friction rub.  No murmur heard. Pulmonary/Chest: Effort normal and breath sounds normal. No stridor. No respiratory distress. She has no wheezes.  Abdominal: Soft. Bowel sounds are normal. There is no tenderness. There is no guarding.  Musculoskeletal: Normal range of motion.  Neurological: She is alert and oriented to person, place, and time. Coordination normal.  Skin: Skin is warm and dry. She is not diaphoretic.  Psychiatric: She has a normal mood and affect. Her behavior is normal.  Nursing note and vitals reviewed.    ED Treatments /  Results  Labs (all labs ordered are listed, but only abnormal results are displayed) Labs Reviewed  BASIC METABOLIC PANEL - Abnormal; Notable for the following components:      Result Value   Sodium 133 (*)    Chloride 97 (*)    All other components within normal limits  CBC WITH DIFFERENTIAL/PLATELET - Abnormal; Notable for the following components:   Hemoglobin 11.6 (*)    HCT 35.4 (*)    All other components within normal limits    EKG  EKG Interpretation  Date/Time:  Tuesday May 13 2017 21:04:19 EST Ventricular Rate:  64 PR Interval:    QRS Duration: 110 QT Interval:  482 QTC Calculation: 498 R Axis:   16 Text Interpretation:  Sinus rhythm Nonspecific T abnormalities, lateral leads Borderline prolonged QT interval No significant change since last tracing Confirmed by Deno Etienne [phone]) on 05/13/2017 9:24:01 PM       Radiology No results found.  Procedures  Procedures (including critical care time)  Medications Ordered in ED Medications - No data to display   Initial Impression / Assessment and Plan / ED Course  I have reviewed the triage vital signs and the nursing notes.  Pertinent labs & imaging results that were available during my care of the patient were reviewed by me and considered in my medical decision making (see chart for details).     Presents for losartan refill. Patient has no complaints. Her blood pressure is elevated to 172/84 in the emergency department.  No vision loss, headache, numbness, weakness, chest pain or shortness of breath.  No concern for hypertensive emergency at this time.  Labs reviewed. She has a mild hyponatremia of 133, otherwise BMP unremarkable.  Hemoglobin is at baseline.  EKG without acute abnormality.  Will fill her losartan medication.  Have also given her information to find a PCP in the area for blood pressure recheck.  Discussed this patient with Dr. Tyrone Nine who agrees with plan and discharge. Counseled the patient on  return precautions and she agrees and voiced understanding to the plan.   Final Clinical Impressions(s) / ED Diagnoses   Final diagnoses:  Medication refill    ED Discharge Orders        Ordered    losartan (COZAAR) 100 MG tablet  Daily     05/13/17 2334       Glyn Ade, PA-C 05/13/17 2347    Deno Etienne, DO 05/14/17 1505

## 2017-05-13 NOTE — ED Triage Notes (Signed)
Pt was here 1 month ago and was started on HTN medication, pt took her last dose today and does not have a PCP, here today to have medication refilled. No denies any symptoms.

## 2017-05-13 NOTE — ED Notes (Signed)
Pt family constantly coming to [facility] to inquire about d/c. EDP in another pt's room

## 2017-05-13 NOTE — ED Provider Notes (Signed)
Patient placed in Quick Look pathway, seen and evaluated   Chief Complaint: Hypertension, medication refill  HPI:   Patient presents requesting refill of losartan.  She was here 1 month ago for evaluation of high blood pressure and was discharged with a 1 month supply for 100 mg of losartan daily.  She took her last tablet this morning.  She has not been able to establish care with a primary care physician and she or her daughter have lost the paperwork that were given to her at the last visit.  She endorses occasional headaches but denies any at this time.  Denies vision changes, shortness of breath, chest pain, abdominal pain, or decreased urine output.  ROS: Positive for high blood pressure, negative for chest pain, shortness of breath, headaches, vision changes, abdominal pain, or decreased urine.  Physical Exam:   Gen: No distress  Neuro: Awake and Alert  Skin: Warm    Focused Exam: Fluent speech, no facial droop, pupils equal and reactive to light bilaterally.  Regular rate and rhythm.  Equal rise and fall of chest, no increased work of breathing.  Lungs clear to auscultation bilaterally.  Abdomen is soft and nontender.   Initiation of care has begun. The patient has been counseled on the process, plan, and necessity for staying for the completion/evaluation, and the remainder of the medical screening examination    Renita Papa, PA-C 05/13/17 1818    Sherwood Gambler, MD 05/14/17 [phone]

## 2017-05-13 NOTE — Discharge Instructions (Addendum)
Your labs and EKG are reassuring.   I refilled your blood pressure medicine.  Please use the highlighted [phone] number to find a primary care doctor in the area.  It is important that you schedule an appointment once you find a doctor to have recheck of your blood pressure.

## 2017-09-25 ENCOUNTER — Other Ambulatory Visit: Payer: Self-pay

## 2017-09-25 NOTE — Patient Outreach (Signed)
[facility]) Care Management  09/25/2017  [name] [birth year] [id number]   Medication Adherence call to [name] left a message for patient to call back patient is due on Losartan 100 mg.[name] is showing past due under [place].  [facility] Management Direct Dial [phone]  Fax [phone] [name]

## 2017-10-20 ENCOUNTER — Other Ambulatory Visit: Payer: Self-pay | Admitting: Family Medicine

## 2017-11-06 DIAGNOSIS — E785 Hyperlipidemia, unspecified: Secondary | ICD-10-CM | POA: Diagnosis not present

## 2017-11-06 DIAGNOSIS — I10 Essential (primary) hypertension: Secondary | ICD-10-CM | POA: Diagnosis not present

## 2017-11-06 DIAGNOSIS — Z760 Encounter for issue of repeat prescription: Secondary | ICD-10-CM | POA: Diagnosis not present

## 2017-11-07 ENCOUNTER — Other Ambulatory Visit: Payer: Self-pay | Admitting: Family Medicine

## 2018-01-04 ENCOUNTER — Emergency Department (HOSPITAL_COMMUNITY): Payer: Medicare Other

## 2018-01-04 ENCOUNTER — Encounter (HOSPITAL_COMMUNITY): Payer: Self-pay | Admitting: Emergency Medicine

## 2018-01-04 ENCOUNTER — Observation Stay (HOSPITAL_COMMUNITY)
Admission: EM | Admit: 2018-01-04 | Discharge: 2018-01-05 | Disposition: A | Discharge status: Home / Self Care | Payer: Medicare Other | Attending: Internal Medicine | Admitting: Internal Medicine

## 2018-01-04 DIAGNOSIS — M8580 Other specified disorders of bone density and structure, unspecified site: Secondary | ICD-10-CM | POA: Insufficient documentation

## 2018-01-04 DIAGNOSIS — E875 Hyperkalemia: Secondary | ICD-10-CM | POA: Insufficient documentation

## 2018-01-04 DIAGNOSIS — Z7982 Long term (current) use of aspirin: Secondary | ICD-10-CM | POA: Insufficient documentation

## 2018-01-04 DIAGNOSIS — Z79899 Other long term (current) drug therapy: Secondary | ICD-10-CM | POA: Insufficient documentation

## 2018-01-04 DIAGNOSIS — E876 Hypokalemia: Secondary | ICD-10-CM | POA: Diagnosis present

## 2018-01-04 DIAGNOSIS — S3991XA Unspecified injury of abdomen, initial encounter: Secondary | ICD-10-CM | POA: Diagnosis not present

## 2018-01-04 DIAGNOSIS — S2222XA Fracture of body of sternum, initial encounter for closed fracture: Secondary | ICD-10-CM | POA: Insufficient documentation

## 2018-01-04 DIAGNOSIS — Z8249 Family history of ischemic heart disease and other diseases of the circulatory system: Secondary | ICD-10-CM | POA: Insufficient documentation

## 2018-01-04 DIAGNOSIS — S2241XA Multiple fractures of ribs, right side, initial encounter for closed fracture: Secondary | ICD-10-CM | POA: Diagnosis not present

## 2018-01-04 DIAGNOSIS — W010XXA Fall on same level from slipping, tripping and stumbling without subsequent striking against object, initial encounter: Secondary | ICD-10-CM | POA: Insufficient documentation

## 2018-01-04 DIAGNOSIS — Z8262 Family history of osteoporosis: Secondary | ICD-10-CM | POA: Diagnosis not present

## 2018-01-04 DIAGNOSIS — R109 Unspecified abdominal pain: Secondary | ICD-10-CM | POA: Diagnosis not present

## 2018-01-04 DIAGNOSIS — R0781 Pleurodynia: Secondary | ICD-10-CM | POA: Diagnosis not present

## 2018-01-04 DIAGNOSIS — Z888 Allergy status to other drugs, medicaments and biological substances status: Secondary | ICD-10-CM | POA: Diagnosis not present

## 2018-01-04 DIAGNOSIS — R2681 Unsteadiness on feet: Secondary | ICD-10-CM | POA: Diagnosis not present

## 2018-01-04 DIAGNOSIS — I10 Essential (primary) hypertension: Secondary | ICD-10-CM | POA: Insufficient documentation

## 2018-01-04 DIAGNOSIS — K449 Diaphragmatic hernia without obstruction or gangrene: Secondary | ICD-10-CM | POA: Diagnosis not present

## 2018-01-04 DIAGNOSIS — E785 Hyperlipidemia, unspecified: Secondary | ICD-10-CM | POA: Diagnosis not present

## 2018-01-04 DIAGNOSIS — S32511A Fracture of superior rim of right pubis, initial encounter for closed fracture: Secondary | ICD-10-CM | POA: Insufficient documentation

## 2018-01-04 DIAGNOSIS — S2241XD Multiple fractures of ribs, right side, subsequent encounter for fracture with routine healing: Secondary | ICD-10-CM | POA: Diagnosis not present

## 2018-01-04 DIAGNOSIS — R0789 Other chest pain: Secondary | ICD-10-CM | POA: Diagnosis not present

## 2018-01-04 DIAGNOSIS — S299XXA Unspecified injury of thorax, initial encounter: Secondary | ICD-10-CM | POA: Diagnosis not present

## 2018-01-04 LAB — CBC WITH DIFFERENTIAL/PLATELET
Abs Immature Granulocytes: 0.04 10*3/uL (ref 0.00–0.07)
BASOS PCT: 1 %
Basophils Absolute: 0.1 10*3/uL (ref 0.0–0.1)
EOS ABS: 0.3 10*3/uL (ref 0.0–0.5)
Eosinophils Relative: 3 %
HCT: 41.5 % (ref 36.0–46.0)
Hemoglobin: 13.6 g/dL (ref 12.0–15.0)
IMMATURE GRANULOCYTES: 0 %
Lymphocytes Relative: 16 %
Lymphs Abs: 1.5 10*3/uL (ref 0.7–4.0)
MCH: 29.8 pg (ref 26.0–34.0)
MCHC: 32.8 g/dL (ref 30.0–36.0)
MCV: 90.8 fL (ref 80.0–100.0)
MONOS PCT: 7 %
Monocytes Absolute: 0.7 10*3/uL (ref 0.1–1.0)
NEUTROS PCT: 73 %
Neutro Abs: 6.6 10*3/uL (ref 1.7–7.7)
PLATELETS: 402 10*3/uL — AB (ref 150–400)
RBC: 4.57 MIL/uL (ref 3.87–5.11)
RDW: 12.3 % (ref 11.5–15.5)
WBC: 9.1 10*3/uL (ref 4.0–10.5)
nRBC: 0 % (ref 0.0–0.2)

## 2018-01-04 LAB — COMPREHENSIVE METABOLIC PANEL
ALT: 18 U/L (ref 0–44)
AST: 20 U/L (ref 15–41)
Albumin: 3.2 g/dL — ABNORMAL LOW (ref 3.5–5.0)
Alkaline Phosphatase: 94 U/L (ref 38–126)
Anion gap: 11 (ref 5–15)
BUN: 11 mg/dL (ref 8–23)
CHLORIDE: 97 mmol/L — AB (ref 98–111)
CO2: 27 mmol/L (ref 22–32)
Calcium: 9.3 mg/dL (ref 8.9–10.3)
Creatinine, Ser: 0.64 mg/dL (ref 0.44–1.00)
GFR calc Af Amer: >60 mL/min (ref >60)
Glucose, Bld: 96 mg/dL (ref 70–99)
POTASSIUM: 2.6 mmol/L — AB (ref 3.5–5.1)
SODIUM: 135 mmol/L (ref 135–145)
Total Bilirubin: 0.9 mg/dL (ref 0.3–1.2)
Total Protein: 7.9 g/dL (ref 6.5–8.1)

## 2018-01-04 MED ORDER — POTASSIUM CHLORIDE CRYS ER 20 MEQ PO TBCR: 40.0000 meq | EXTENDED_RELEASE_TABLET | ORAL | Status: DC

## 2018-01-04 MED ORDER — ACETAMINOPHEN 650 MG RE SUPP: 650.0000 mg | Freq: Four times a day (QID) | RECTAL | Status: DC | PRN

## 2018-01-04 MED ORDER — CALCIUM CITRATE 950 (200 CA) MG PO TABS
200.0000 mg | ORAL_TABLET | Freq: Every day | ORAL | Status: DC
  Administered 2018-01-05: 200 mg via ORAL
  Filled 2018-01-04: qty 1

## 2018-01-04 MED ORDER — POTASSIUM CHLORIDE CRYS ER 20 MEQ PO TBCR
40.0000 meq | EXTENDED_RELEASE_TABLET | Freq: Once | ORAL | Status: AC
  Administered 2018-01-04: 40 meq via ORAL
  Filled 2018-01-04: qty 2

## 2018-01-04 MED ORDER — CALCIUM CITRATE-VITAMIN D 500-500 MG-UNIT PO CHEW: 1.0000 | CHEWABLE_TABLET | Freq: Every day | ORAL | Status: DC

## 2018-01-04 MED ORDER — OXYCODONE HCL 5 MG PO TABS: 5.0000 mg | ORAL_TABLET | ORAL | Status: DC | PRN

## 2018-01-04 MED ORDER — POTASSIUM CHLORIDE 10 MEQ/100ML IV SOLN
10.0000 meq | INTRAVENOUS | Status: DC
  Administered 2018-01-04 (×2): 10 meq via INTRAVENOUS
  Filled 2018-01-04 (×2): qty 100

## 2018-01-04 MED ORDER — AMLODIPINE BESYLATE 5 MG PO TABS
5.0000 mg | ORAL_TABLET | Freq: Every day | ORAL | Status: DC
  Administered 2018-01-05: 5 mg via ORAL
  Filled 2018-01-04: qty 1

## 2018-01-04 MED ORDER — IOHEXOL 300 MG/ML  SOLN
100.0000 mL | Freq: Once | INTRAMUSCULAR | Status: AC | PRN
  Administered 2018-01-04: 100 mL via INTRAVENOUS

## 2018-01-04 MED ORDER — ACETAMINOPHEN 325 MG PO TABS
650.0000 mg | ORAL_TABLET | Freq: Four times a day (QID) | ORAL | Status: DC | PRN
  Administered 2018-01-05: 650 mg via ORAL
  Filled 2018-01-04: qty 2

## 2018-01-04 MED ORDER — POLYETHYLENE GLYCOL 3350 17 G PO PACK: 17.0000 g | PACK | Freq: Every day | ORAL | Status: DC | PRN

## 2018-01-04 MED ORDER — LOSARTAN POTASSIUM 50 MG PO TABS
100.0000 mg | ORAL_TABLET | Freq: Every day | ORAL | Status: DC
  Administered 2018-01-05: 100 mg via ORAL
  Filled 2018-01-04: qty 2

## 2018-01-04 MED ORDER — VITAMIN D 1000 UNITS PO TABS
1000.0000 [IU] | ORAL_TABLET | Freq: Every day | ORAL | Status: DC
  Administered 2018-01-05: 1000 [IU] via ORAL
  Filled 2018-01-04: qty 1

## 2018-01-04 MED ORDER — ACETAMINOPHEN 500 MG PO TABS
1000.0000 mg | ORAL_TABLET | Freq: Once | ORAL | Status: AC
  Administered 2018-01-04: 1000 mg via ORAL
  Filled 2018-01-04: qty 2

## 2018-01-04 MED ORDER — ENOXAPARIN SODIUM 40 MG/0.4ML ~~LOC~~ SOLN
40.0000 mg | SUBCUTANEOUS | Status: DC
  Administered 2018-01-04: 40 mg via SUBCUTANEOUS
  Filled 2018-01-04: qty 0.4

## 2018-01-04 NOTE — Consult Note (Signed)
Reason for Consult: Right-sided rib fractures multiple Referring Physician: Alvino Chapel MD  Alicia Joseph is an 84 y.o. female.  HPI: Asked to see patient at the request of Dr. Alvino Chapel for right-sided rib fractures.  The patient fell at home 1 week ago.  She has been at home the entire time.  He comes in today due to right-sided chest pain.  Plain chest x-ray shows multiple right-sided rib fractures without displacement.  CT scan of abdomen pelvis was attempted and she has rib fractures 5 through 12 on the right posterior with no displacement.  There is no evidence of pneumonia, hemothorax, pneumothorax or complicating feature.  She is not on oxygen here in the emergency room her work of breathing is normal her biggest complaint is pain with ambulation.  She lives by herself but she does have a daughter here in town.  Overall she feels well and is in good overall health.  Past Medical History:  Diagnosis Date  . Chest pain   . FH: colonic polyps   . Hyperlipidemia   . Hypertension   . Menopause   . Osteopenia     Past Surgical History:  Procedure Laterality Date  . ABDOMINAL HYSTERECTOMY    . CHOLECYSTECTOMY      Family History  Problem Relation Age of Onset  . Cancer Sister        breast  . Cancer Father        lung  . Diabetes Brother   . Cancer Daughter        thyroid    Social History:  reports that she has never smoked. She has never used smokeless tobacco. She reports that she drinks alcohol. She reports that she does not use drugs.  Allergies:  Allergies  Allergen Reactions  . Atorvastatin Other (See Comments)    REACTION: pain in feet  . Ezetimibe-Simvastatin Other (See Comments)    REACTION: pain in feet    Medications: I have reviewed the patient's current medications.  Results for orders placed or performed during the hospital encounter of 01/04/18 (from the past 48 hour(s))  CBC with Differential     Status: Abnormal   Collection Time: 01/04/18  3:45 PM   Result Value Ref Range   WBC 9.1 4.0 - 10.5 K/uL   RBC 4.57 3.87 - 5.11 MIL/uL   Hemoglobin 13.6 12.0 - 15.0 g/dL   HCT 41.5 36.0 - 46.0 %   MCV 90.8 80.0 - 100.0 fL   MCH 29.8 26.0 - 34.0 pg   MCHC 32.8 30.0 - 36.0 g/dL   RDW 12.3 11.5 - 15.5 %   Platelets 402 (H) 150 - 400 K/uL   nRBC 0.0 0.0 - 0.2 %   Neutrophils Relative % 73 %   Neutro Abs 6.6 1.7 - 7.7 K/uL   Lymphocytes Relative 16 %   Lymphs Abs 1.5 0.7 - 4.0 K/uL   Monocytes Relative 7 %   Monocytes Absolute 0.7 0.1 - 1.0 K/uL   Eosinophils Relative 3 %   Eosinophils Absolute 0.3 0.0 - 0.5 K/uL   Basophils Relative 1 %   Basophils Absolute 0.1 0.0 - 0.1 K/uL   Immature Granulocytes 0 %   Abs Immature Granulocytes 0.04 0.00 - 0.07 K/uL    Comment: Performed at [facility], [address] [place],  [address]  Comprehensive metabolic panel     Status: Abnormal   Collection Time: 01/04/18  3:45 PM  Result Value Ref Range   Sodium  135 135 - 145 mmol/L   Potassium 2.6 (LL) 3.5 - 5.1 mmol/L    Comment: CRITICAL RESULT CALLED TO, READ BACK BY AND VERIFIED WITH: Joesph Fillers RN @ 1610 ON 01/04/18 BY TEMOCHE, H    Chloride 97 (L) 98 - 111 mmol/L   CO2 27 22 - 32 mmol/L   Glucose, Bld 96 70 - 99 mg/dL   BUN 11 8 - 23 mg/dL   Creatinine, Ser 0.64 0.44 - 1.00 mg/dL   Calcium 9.3 8.9 - 10.3 mg/dL   Total Protein 7.9 6.5 - 8.1 g/dL   Albumin 3.2 (L) 3.5 - 5.0 g/dL   AST 20 15 - 41 U/L   ALT 18 0 - 44 U/L   Alkaline Phosphatase 94 38 - 126 U/L   Total Bilirubin 0.9 0.3 - 1.2 mg/dL   GFR calc non Af Amer >60 >60 mL/min   GFR calc Af Amer >60 >60 mL/min    Comment: (NOTE) The eGFR has been calculated using the CKD EPI equation. This calculation has not been validated in all clinical situations. eGFR's persistently <60 mL/min signify possible Chronic Kidney Disease.    Anion gap 11 5 - 15    Comment: Performed at [facility] [address], [place], [place] [address]    Ct Chest W  Contrast  Result Date: 01/04/2018 CLINICAL DATA:  [place] into a wall one week ago. Blunt trauma. Persistent right chest and abdominal pain and tenderness. Initial encounter. EXAM: CT CHEST, ABDOMEN, AND PELVIS WITH CONTRAST TECHNIQUE: Multidetector CT imaging of the chest, abdomen and pelvis was performed following the standard protocol during bolus administration of intravenous contrast. CONTRAST:  18m OMNIPAQUE IOHEXOL 300 MG/ML  SOLN COMPARISON:  None. FINDINGS: CT CHEST FINDINGS Cardiovascular: No evidence of thoracic aortic injury or mediastinal hematoma. No pericardial effusion. Aortic and coronary artery atherosclerosis. Mediastinum/Nodes: No masses or pathologically enlarged lymph nodes identified. Lungs/Pleura: No evidence of pulmonary contusion or other infiltrate. No evidence of pneumothorax or hemothorax. Musculoskeletal: Nondisplaced fractures are seen involving the right posterior 5th through 12th ribs. A subacute healing fracture of the body of the sternum is noted which shows periosteal new bone formation. CT ABDOMEN PELVIS FINDINGS Hepatobiliary: No hepatic laceration identified. Small cysts seen in the inferior right hepatic lobe. Prior cholecystectomy. No evidence of biliary obstruction. Pancreas: No parenchymal laceration, mass, or inflammatory changes identified. Spleen: No evidence of splenic laceration. Adrenal/Urinary Tract: No hemorrhage or parenchymal lacerations identified. No evidence of mass or hydronephrosis. Stomach/Bowel: Tiny hiatal hernia noted. Unopacified bowel loops are unremarkable in appearance. No evidence of hemoperitoneum. Diverticulosis is seen mainly involving the sigmoid colon, however there is no evidence of diverticulitis. Vascular/Lymphatic: No evidence of abdominal aortic injury. Aortic atherosclerosis. No pathologically enlarged lymph nodes identified. Reproductive:  No mass or other significant abnormality identified. Other:  None. Musculoskeletal: No acute  fractures identified. Subacute to chronic fractures of the right superior and inferior pubic rami noted. IMPRESSION: Acute nondisplaced fractures of the right posterior 5th through 12th ribs. Subacute healing fractures of the sternum and right pubic rami. No evidence of aortic or visceral organ injury. Tiny hiatal hernia. Electronically Signed   By: JEarle GellM.D.   On: 01/04/2018 17:56   Ct Abdomen Pelvis W Contrast  Result Date: 01/04/2018 CLINICAL DATA:  [place]into a wall one week ago. Blunt trauma. Persistent right chest and abdominal pain and tenderness. Initial encounter. EXAM: CT CHEST, ABDOMEN, AND PELVIS WITH CONTRAST TECHNIQUE: Multidetector CT imaging of the chest,  abdomen and pelvis was performed following the standard protocol during bolus administration of intravenous contrast. CONTRAST:  158m OMNIPAQUE IOHEXOL 300 MG/ML  SOLN COMPARISON:  None. FINDINGS: CT CHEST FINDINGS Cardiovascular: No evidence of thoracic aortic injury or mediastinal hematoma. No pericardial effusion. Aortic and coronary artery atherosclerosis. Mediastinum/Nodes: No masses or pathologically enlarged lymph nodes identified. Lungs/Pleura: No evidence of pulmonary contusion or other infiltrate. No evidence of pneumothorax or hemothorax. Musculoskeletal: Nondisplaced fractures are seen involving the right posterior 5th through 12th ribs. A subacute healing fracture of the body of the sternum is noted which shows periosteal new bone formation. CT ABDOMEN PELVIS FINDINGS Hepatobiliary: No hepatic laceration identified. Small cysts seen in the inferior right hepatic lobe. Prior cholecystectomy. No evidence of biliary obstruction. Pancreas: No parenchymal laceration, mass, or inflammatory changes identified. Spleen: No evidence of splenic laceration. Adrenal/Urinary Tract: No hemorrhage or parenchymal lacerations identified. No evidence of mass or hydronephrosis. Stomach/Bowel: Tiny hiatal hernia noted. Unopacified bowel loops  are unremarkable in appearance. No evidence of hemoperitoneum. Diverticulosis is seen mainly involving the sigmoid colon, however there is no evidence of diverticulitis. Vascular/Lymphatic: No evidence of abdominal aortic injury. Aortic atherosclerosis. No pathologically enlarged lymph nodes identified. Reproductive:  No mass or other significant abnormality identified. Other:  None. Musculoskeletal: No acute fractures identified. Subacute to chronic fractures of the right superior and inferior pubic rami noted. IMPRESSION: Acute nondisplaced fractures of the right posterior 5th through 12th ribs. Subacute healing fractures of the sternum and right pubic rami. No evidence of aortic or visceral organ injury. Tiny hiatal hernia. Electronically Signed   By: JEarle GellM.D.   On: 01/04/2018 17:56   Dg Chest Portable 1 View  Result Date: 01/04/2018 CLINICAL DATA:  Right rib pain after fall. EXAM: PORTABLE CHEST 1 VIEW COMPARISON:  Radiograph of Jul 27, 2016. FINDINGS: The heart size and mediastinal contours are within normal limits. Both lungs are clear. The visualized skeletal structures are unremarkable. IMPRESSION: No active disease. Electronically Signed   By: JMarijo Conception M.D.   On: 01/04/2018 16:28    Review of Systems  Respiratory: Negative for cough, hemoptysis, shortness of breath and wheezing.   Cardiovascular: Positive for chest pain.  All other systems reviewed and are negative.  Blood pressure (!) 203/67, pulse 76, temperature 98.7 F (37.1 C), resp. rate 20, SpO2 96 %. Physical Exam  Constitutional: She is oriented to person, place, and time. She appears well-developed and well-nourished. No distress.  HENT:  Head: Normocephalic and atraumatic.  Eyes: Pupils are equal, round, and reactive to light.  Neck: Normal range of motion. Neck supple.  Cardiovascular: Normal rate and regular rhythm.  Respiratory: Effort normal and breath sounds normal. No respiratory distress. She has no  wheezes. She has no rales. She exhibits tenderness.  GI: Soft. Bowel sounds are normal. She exhibits no distension.  Musculoskeletal: Normal range of motion.  Neurological: She is alert and oriented to person, place, and time.  Skin: Skin is warm and dry. She is not diaphoretic.  Psychiatric: She has a normal mood and affect. Her behavior is normal. Judgment and thought content normal.    Assessment/Plan: Right-sided rib fracture without complication 1 week out  Patient requires only pain control and placement.  She has no complicating feature of this injury therefore does not require admission to the trauma service.   Recommend medical evaluation for placement this point time.  She could go home on oral pain medication if her daughter is able to help take  care of her.  If not she may require skilled nursing for convalescence but does not require the services of the trauma service at this point given that her injury was 1 week out and she has no complicating feature of it except requiring pain control which she will have for up to 6 weeks.  Xzayvion Vaeth A Yuji Walth 01/04/2018, 7:39 PM

## 2018-01-04 NOTE — Progress Notes (Signed)
Patient is refusing treatment and telemetry. Patient is alert to place and time. Not sure why she is in hospital and respectfully refusing treatment. Dr. Truman Hayward notified.

## 2018-01-04 NOTE — ED Provider Notes (Signed)
[place] EMERGENCY DEPARTMENT Provider Note   CSN: [id number] Arrival date & time: 01/04/18  1345     History   Chief Complaint Chief Complaint  Patient presents with  . Fall  . rib pain    HPI Alicia Joseph is a 84 y.o. female.  HPI  Patient is a 84 year old female with PMHx of HTN, HLD, and osteopenia who presents s/p mechanical fall on Monday (7 days ago) with continue R sided pain.  Patient reports slipping on concrete while waving goodbye to her daughter.  She has had constant however slightly improving 5/10 R sided achy pain isolated to R lower ribs, RUQ, and R back.  Pain worsens with movement  and improves with rest.  No tx PTA.  No head trauma or LOC.  No anticoagulation use.  She has been lying in bed for 3 days because of the pain.  She denies HA, SOB, N/V/D, abdominal pain, and difficulty urinating.  At baseline able to perform ADLs and lives alone.  Past Medical History:  Diagnosis Date  . Chest pain   . FH: colonic polyps   . Hyperlipidemia   . Hypertension   . Menopause   . Osteopenia     Patient Active Problem List   Diagnosis Date Noted  . Left arm pain   . Acute blood loss anemia   . Humeral surgical neck fracture 07/27/2016  . Shoulder pain, acute 07/27/2016  . Shoulder fracture 07/27/2016  . Hypokalemia 07/27/2016  . Memory loss 07/17/2016  . Glenoid fracture of shoulder, left, closed, initial encounter 02/22/2016  . Broken nose 06/23/2013  . Facial laceration 06/23/2013  . Fall at home 06/23/2013  . Benign paroxysmal positional vertigo 01/25/2013  . Muscle spasm 08/27/2012  . Rib pain 12/04/2011  . Hypertension   . General medical examination 03/08/2011  . Hyperglycemia 03/08/2011  . Foot pain 10/23/2010  . UNSPECIFIED SLEEP APNEA 11/13/2009  . Anxiety state 11/10/2009  . HYPOXEMIA 11/10/2009  . BACK PAIN 02/15/2008  . Hyperlipidemia 01/14/2007  . MENOPAUSE, SURGICAL 01/14/2007  . Osteopenia 01/14/2007    Past  Surgical History:  Procedure Laterality Date  . ABDOMINAL HYSTERECTOMY    . CHOLECYSTECTOMY       OB History   None      Home Medications    Prior to Admission medications   Medication Sig Start Date End Date Taking? Authorizing Provider  Aspirin-Caffeine (ANACIN PO) Take 1 tablet by mouth every 6 (six) hours as needed (pain).   Yes [provider]  ezetimibe (ZETIA) 10 MG tablet Take 1 tablet (10 mg total) by mouth daily. 07/17/16  Yes Midge Minium, MD  losartan (COZAAR) 100 MG tablet Take 1 tablet (100 mg total) by mouth daily. 05/13/17  Yes Glyn Ade, PA-C  acetaminophen (TYLENOL) 325 MG tablet Take 2 tablets (650 mg total) by mouth every 6 (six) hours as needed for mild pain or moderate pain. Patient not taking: Reported on 04/17/2017 07/28/16   Rai, Vernelle Emerald, MD  Acetaminophen 500 MG coapsule Take 1-2 capsules (500-1,000 mg total) by mouth every 8 (eight) hours as needed for fever. Patient not taking: Reported on 05/13/2017 04/17/17   Jeannett Senior, PA-C  HYDROcodone-acetaminophen (NORCO/VICODIN) 5-325 MG tablet Take 1 tablet by mouth every 4 (four) hours as needed for severe pain. Patient not taking: Reported on 05/13/2017 08/05/16   Midge Minium, MD  losartan (COZAAR) 100 MG tablet Take 1 tablet (100 mg total) by mouth daily.  Patient not taking: Reported on 01/04/2018 07/17/16   Midge Minium, MD  polyethylene glycol [facility] / Floria Raveling) packet Take 17 g by mouth daily as needed for moderate constipation. Patient not taking: Reported on 04/17/2017 07/28/16   Rai, Vernelle Emerald, MD  senna-docusate (SENOKOT-S) 8.6-50 MG tablet Take 1 tablet by mouth 2 (two) times daily. For constipation Patient not taking: Reported on 04/17/2017 07/28/16   Mendel Corning, MD    Family History Family History  Problem Relation Age of Onset  . Cancer Sister        breast  . Cancer Father        lung  . Diabetes Brother   . Cancer Daughter        thyroid     Social History Social History   Tobacco Use  . Smoking status: Never Smoker  . Smokeless tobacco: Never Used  Substance Use Topics  . Alcohol use: Yes  . Drug use: No     Allergies   Atorvastatin and Ezetimibe-simvastatin   Review of Systems Review of Systems  Constitutional: Negative for chills and fever.  HENT: Negative for sore throat.   Eyes: Negative for pain and visual disturbance.  Respiratory: Negative for cough and shortness of breath.   Cardiovascular: Positive for chest pain (R lower). Negative for palpitations.  Gastrointestinal: Positive for abdominal pain (R sided). Negative for diarrhea, nausea and vomiting.  Genitourinary: Negative for dysuria and hematuria.  Musculoskeletal: Positive for back pain (R). Negative for arthralgias and neck pain.  Skin: Negative for color change and rash.  Neurological: Negative for seizures, syncope and headaches.  All other systems reviewed and are negative.    Physical Exam Updated Vital Signs BP (!) 209/88   Pulse 75   Temp 98 F (36.7 C) (Oral)   Resp 20   Ht 5\' 2"  (1.575 m)   Wt 49.6 kg Comment: Scale C  SpO2 97%   BMI 20.01 kg/m   Physical Exam  Constitutional: She is oriented to person, place, and time. She appears well-developed and well-nourished. No distress.  HENT:  Head: Normocephalic and atraumatic.  Mouth/Throat: Oropharynx is clear and moist.  Eyes: Pupils are equal, round, and reactive to light. Conjunctivae and EOM are normal.  Neck: Normal range of motion. Neck supple.  Cardiovascular: Normal rate, regular rhythm and intact distal pulses.  Pulmonary/Chest: Effort normal and breath sounds normal. No respiratory distress. She exhibits tenderness (R lower).  Abdominal: Soft. She exhibits no distension. There is tenderness (RUQ). There is guarding.  No peritoneal signs.  Musculoskeletal: Normal range of motion.  Extremities atraumatic with full ROM and strength.  No pelvic TTP. R sided back TTP  overlying R ribs.  NVI throughout with 2+ radial and DP pulses.  Neurological: She is alert and oriented to person, place, and time.  Skin: Skin is warm and dry.  No obvious ecchymosis, abrasions or wounds overlying R side  Psychiatric: She has a normal mood and affect.  Nursing note and vitals reviewed.    ED Treatments / Results  Labs (all labs ordered are listed, but only abnormal results are displayed) Labs Reviewed  CBC WITH DIFFERENTIAL/PLATELET - Abnormal; Notable for the following components:      Result Value   Platelets 402 (*)    All other components within normal limits  COMPREHENSIVE METABOLIC PANEL - Abnormal; Notable for the following components:   Potassium 2.6 (*)    Chloride 97 (*)    Albumin 3.2 (*)  All other components within normal limits  BASIC METABOLIC PANEL  MAGNESIUM    EKG None  Radiology Ct Chest W Contrast  Result Date: 01/04/2018 CLINICAL DATA:  [place] into a wall one week ago. Blunt trauma. Persistent right chest and abdominal pain and tenderness. Initial encounter. EXAM: CT CHEST, ABDOMEN, AND PELVIS WITH CONTRAST TECHNIQUE: Multidetector CT imaging of the chest, abdomen and pelvis was performed following the standard protocol during bolus administration of intravenous contrast. CONTRAST:  120mL OMNIPAQUE IOHEXOL 300 MG/ML  SOLN COMPARISON:  None. FINDINGS: CT CHEST FINDINGS Cardiovascular: No evidence of thoracic aortic injury or mediastinal hematoma. No pericardial effusion. Aortic and coronary artery atherosclerosis. Mediastinum/Nodes: No masses or pathologically enlarged lymph nodes identified. Lungs/Pleura: No evidence of pulmonary contusion or other infiltrate. No evidence of pneumothorax or hemothorax. Musculoskeletal: Nondisplaced fractures are seen involving the right posterior 5th through 12th ribs. A subacute healing fracture of the body of the sternum is noted which shows periosteal new bone formation. CT ABDOMEN PELVIS FINDINGS  Hepatobiliary: No hepatic laceration identified. Small cysts seen in the inferior right hepatic lobe. Prior cholecystectomy. No evidence of biliary obstruction. Pancreas: No parenchymal laceration, mass, or inflammatory changes identified. Spleen: No evidence of splenic laceration. Adrenal/Urinary Tract: No hemorrhage or parenchymal lacerations identified. No evidence of mass or hydronephrosis. Stomach/Bowel: Tiny hiatal hernia noted. Unopacified bowel loops are unremarkable in appearance. No evidence of hemoperitoneum. Diverticulosis is seen mainly involving the sigmoid colon, however there is no evidence of diverticulitis. Vascular/Lymphatic: No evidence of abdominal aortic injury. Aortic atherosclerosis. No pathologically enlarged lymph nodes identified. Reproductive:  No mass or other significant abnormality identified. Other:  None. Musculoskeletal: No acute fractures identified. Subacute to chronic fractures of the right superior and inferior pubic rami noted. IMPRESSION: Acute nondisplaced fractures of the right posterior 5th through 12th ribs. Subacute healing fractures of the sternum and right pubic rami. No evidence of aortic or visceral organ injury. Tiny hiatal hernia. Electronically Signed   By: Earle Gell M.D.   On: 01/04/2018 17:56   Ct Abdomen Pelvis W Contrast  Result Date: 01/04/2018 CLINICAL DATA:  [place] into a wall one week ago. Blunt trauma. Persistent right chest and abdominal pain and tenderness. Initial encounter. EXAM: CT CHEST, ABDOMEN, AND PELVIS WITH CONTRAST TECHNIQUE: Multidetector CT imaging of the chest, abdomen and pelvis was performed following the standard protocol during bolus administration of intravenous contrast. CONTRAST:  143mL OMNIPAQUE IOHEXOL 300 MG/ML  SOLN COMPARISON:  None. FINDINGS: CT CHEST FINDINGS Cardiovascular: No evidence of thoracic aortic injury or mediastinal hematoma. No pericardial effusion. Aortic and coronary artery atherosclerosis. Mediastinum/Nodes:  No masses or pathologically enlarged lymph nodes identified. Lungs/Pleura: No evidence of pulmonary contusion or other infiltrate. No evidence of pneumothorax or hemothorax. Musculoskeletal: Nondisplaced fractures are seen involving the right posterior 5th through 12th ribs. A subacute healing fracture of the body of the sternum is noted which shows periosteal new bone formation. CT ABDOMEN PELVIS FINDINGS Hepatobiliary: No hepatic laceration identified. Small cysts seen in the inferior right hepatic lobe. Prior cholecystectomy. No evidence of biliary obstruction. Pancreas: No parenchymal laceration, mass, or inflammatory changes identified. Spleen: No evidence of splenic laceration. Adrenal/Urinary Tract: No hemorrhage or parenchymal lacerations identified. No evidence of mass or hydronephrosis. Stomach/Bowel: Tiny hiatal hernia noted. Unopacified bowel loops are unremarkable in appearance. No evidence of hemoperitoneum. Diverticulosis is seen mainly involving the sigmoid colon, however there is no evidence of diverticulitis. Vascular/Lymphatic: No evidence of abdominal aortic injury. Aortic atherosclerosis. No pathologically enlarged lymph nodes identified. Reproductive:  No mass or other significant abnormality identified. Other:  None. Musculoskeletal: No acute fractures identified. Subacute to chronic fractures of the right superior and inferior pubic rami noted. IMPRESSION: Acute nondisplaced fractures of the right posterior 5th through 12th ribs. Subacute healing fractures of the sternum and right pubic rami. No evidence of aortic or visceral organ injury. Tiny hiatal hernia. Electronically Signed   By: Earle Gell M.D.   On: 01/04/2018 17:56   Dg Chest Portable 1 View  Result Date: 01/04/2018 CLINICAL DATA:  Right rib pain after fall. EXAM: PORTABLE CHEST 1 VIEW COMPARISON:  Radiograph of Jul 27, 2016. FINDINGS: The heart size and mediastinal contours are within normal limits. Both lungs are clear. The  visualized skeletal structures are unremarkable. IMPRESSION: No active disease. Electronically Signed   By: Marijo Conception, M.D.   On: 01/04/2018 16:28    Procedures Procedures (including critical care time)  Medications Ordered in ED Medications  potassium chloride 10 mEq in 100 mL IVPB (10 mEq Intravenous New Bag/Given 01/04/18 2212)  enoxaparin (LOVENOX) injection 40 mg (40 mg Subcutaneous Given 01/04/18 2212)  acetaminophen (TYLENOL) tablet 650 mg (has no administration in time range)    Or  acetaminophen (TYLENOL) suppository 650 mg (has no administration in time range)  oxyCODONE (Oxy IR/ROXICODONE) immediate release tablet 5 mg (has no administration in time range)  polyethylene glycol (MIRALAX / GLYCOLAX) packet 17 g (has no administration in time range)  losartan (COZAAR) tablet 100 mg (has no administration in time range)  amLODipine (NORVASC) tablet 5 mg (has no administration in time range)  calcium citrate (CALCITRATE - dosed in mg elemental calcium) tablet 200 mg of elemental calcium (has no administration in time range)    And  cholecalciferol (VITAMIN D) tablet 1,000 Units (has no administration in time range)  acetaminophen (TYLENOL) tablet 1,000 mg (1,000 mg Oral Given 01/04/18 1555)  iohexol (OMNIPAQUE) 300 MG/ML solution 100 mL (100 mLs Intravenous Contrast Given 01/04/18 1711)  potassium chloride SA (K-DUR,KLOR-CON) CR tablet 40 mEq (40 mEq Oral Given 01/04/18 1809)     Initial Impression / Assessment and Plan / ED Course  I have reviewed the triage vital signs and the nursing notes.  Pertinent labs & imaging results that were available during my care of the patient were reviewed by me and considered in my medical decision making (see chart for details).     Patient is a 84 year old female with PMHx of HTN, HLD, and osteopenia who presents s/p mechanical fall on Monday (7 days ago) with continued R sided pain. She is HDS and otherwise well appearing.  Exam as  above.  Tylenol given for pain.  Portable CXR without obvious PTX however questionable multiple R sided rib fractures.  Bedside FAST negative.  Positive lung sliding.  Given age, risk factors, and continued pain over the last week, will obtain CT scans.    CT C/A/P with acute nondisplaced fractures of the R posterior 5-12 ribs.  Subacute healing fractures of sternum and R pubic rami.  No other aortic or visceral organ injury identified.  Labs significant for hypokalemia of 2.6 which was repleted otherwise unremarkable.  Discussed case with trauma who evaluated patient in the ED and recommends medicine admission for pain control and placement.  Discussed case with medicine who will admit.  Patient updated with results and plan who verbalized her understanding and is in agreement.  HDS at transfer.  Final Clinical Impressions(s) / ED Diagnoses   Final diagnoses:  Closed fracture  of multiple ribs of right side, initial encounter    ED Discharge Orders    None       Fabian November, MD 01/04/18 [id number]    Davonna Belling, MD 01/08/18 [phone]

## 2018-01-04 NOTE — H&P (Signed)
Date: 01/04/2018               Patient Name:  Alicia Joseph MRN: [id number]  DOB: 07-19-1929 Age / Sex: 84 y.o., female   PCP: Patient, No Pcp Per         Medical Service: Internal Medicine Teaching Service         Attending Physician: Dr. Aldine Contes, MD    First Contact: Dr. Lonia Skinner Pager: [phone]  Second Contact: Dr. Kalman Shan Pager: [phone]       After Hours (After 5p/  First Contact Pager: [phone]  weekends / holidays): Second Contact Pager: [phone]   Chief Complaint: Fall  History of Present Illness: Alicia Joseph is a 84 yo F w/ PMH of HTN, HLD, Osteopenia presenting after a fall. She states that she was sending her daughter off when she slipped on a wet surface and slid into a wall on 12/29/17. She states she had significant pain described as soreness on the right side on the shoulder, chest, and back so she 'made an appointment' for 2pm today. Pain is exacerbated by movement and exertion but she is able to take deep breaths. She stated since the fall she had difficulty performing her IADLs and stayed mostly in bed although her daughter came and brought her some pain medication which improved the pain. She states that at baseline she is able to perform her IADLs and ADLs without assistance. Denies any loss of consciousness, headaches, dizziness, nausea, light-headedness, chest pain, palpations, dyspnea. Denies F/N/V/D/C. No numbness, tingling, or weakness. She states she would like to go home since she was told there is no need for acute intervention for her rib fractures. When explained to her that we would like to keep her overnight to replete her potassium, she agreed to stay.  In the ED, she was found to have non-displaced fractures on right posterior 5th-12th ribs as well as subacute healing fractures of sternum and right pubic superior and inferior rami. She was evaluated by trauma who recommended supportive care at the time. She was given 1g of Tylenol.  She was also found to have K of 2.6 and given 19mEq of K-dur.  Meds:  Current Meds  Medication Sig  . Aspirin-Caffeine (ANACIN PO) Take 1 tablet by mouth every 6 (six) hours as needed (pain).  Marland Kitchen ezetimibe (ZETIA) 10 MG tablet Take 1 tablet (10 mg total) by mouth daily.  Marland Kitchen losartan (COZAAR) 100 MG tablet Take 1 tablet (100 mg total) by mouth daily.   Allergies: Allergies as of 01/04/2018 - Review Complete 01/04/2018  Allergen Reaction Noted  . Atorvastatin Other (See Comments) 02/15/2010  . Ezetimibe-simvastatin Other (See Comments) 01/14/2007   Past Medical History:  Diagnosis Date  . Chest pain   . FH: colonic polyps   . Hyperlipidemia   . Hypertension   . Menopause   . Osteopenia    Family History:  Family History  Problem Relation Age of Onset  . Cancer Sister        breast  . Cancer Father        lung  . Diabetes Brother   . Cancer Daughter        thyroid   Social History:  Lives at home by herself. Has a daughter who lives 3 streets away. Able to perform most IADLs by herself. Drinks about 3 ounces of wine every night. Denies tobacco and illicit substance use.  Review of Systems: A complete ROS was negative except  as per HPI.   Physical Exam: Blood pressure (!) 167/109, pulse 75, temperature 98.7 F (37.1 C), resp. rate (!) 25, SpO2 99 %. Physical Exam  Constitutional: She is oriented to person, place, and time and well-developed, well-nourished, and in no distress. No distress.  HENT:  Head: Normocephalic and atraumatic.  Mouth/Throat: Oropharynx is clear and moist. No oropharyngeal exudate.  Eyes: Pupils are equal, round, and reactive to light. Conjunctivae and EOM are normal. No scleral icterus.  Neck: Normal range of motion. Neck supple. No JVD present.  Cardiovascular: Normal rate, regular rhythm, normal heart sounds and intact distal pulses.  Pulmonary/Chest: Effort normal and breath sounds normal.  Abdominal: Soft. Bowel sounds are normal. There is no  tenderness. There is no guarding.  Musculoskeletal: Normal range of motion. She exhibits tenderness (tenderness on movement of back, shoulder). She exhibits no edema or deformity.  Neurological: She is alert and oriented to person, place, and time. GCS score is 15.  Bilateral lower extremities strength and sensation intact  Skin: Skin is warm and dry. She is not diaphoretic.  No obvious ecchymosis  Psychiatric: Mood, affect and judgment normal.   EKG: N/A  CXR: personally reviewed my interpretation is normal mediastinum,   Assessment & Plan by Problem: Alicia Joseph is a 84 yo F w/ PMH of HTN, HLD presenting with right sided back pain after a fall. She was evaluated by trauma surgeon and found to have multiple rib fractures but has no evidence of flail chest or difficulty breathing. Incidentally she was found to have hypokalemia most likely due to undernutrition after a week of staying in bed. Denies any diuretics, insulin, or albuterol use. She will admit to observation while her potassium is repleted.  Right sided rib fracture 2/2 mechanical fall Fracture after slipping on wet surface in the garage. No hx of seizures or syncope. Age and gender makes her susceptible to osteopenia. Chest CT confirm non-displaced fracture on right posterior rib 5th to 12th. No evidence of flail chest, hemothorax, pneumothorax. Need supportive care per trauma surgery - C/w Tylenol PRN 650mg  for pain - Vit D 1000 units daily & calcium citrate 200mg  daily - Oxycodone 5mg  q4hr PRN only for severe pain - Incentive spirometry  Hypokalemia 2/2 malnutrition K on admit 2.6. Spent 3 days staying in bed all day. Endorsing missing multiple meals. - Received K-dur PO 49mEq in ED - KCl IV x4 - Trend BMP  Hypertension Admit bp 185/91 On chart review appears to have elevated bp around 144-315 systolic at baseline.  - Add on amlodipine 5mg  daily to home med losartan 100mg  daily  DVT prophx: Lovenox Diet: Cardiac Bowel:  Miralax Code: Full  Dispo: Admit patient to Observation with expected length of stay less than 2 midnights.  Signed: Mosetta Anis, MD 01/04/2018, 9:29 PM  Pager: [phone]

## 2018-01-04 NOTE — ED Triage Notes (Signed)
Pt states she fell into a wall on Monday, she still has pain to her right ribs. She has tenderness to RUQ abdomen. No bruising noted on assessment.

## 2018-01-04 NOTE — Progress Notes (Signed)
Patient arrived from [facility] to [id number]. A&O x 4. Patient has no skin breakdown. SR on telemetry. VSS.

## 2018-01-05 DIAGNOSIS — E876 Hypokalemia: Secondary | ICD-10-CM | POA: Diagnosis not present

## 2018-01-05 DIAGNOSIS — S2220XA Unspecified fracture of sternum, initial encounter for closed fracture: Secondary | ICD-10-CM | POA: Diagnosis not present

## 2018-01-05 DIAGNOSIS — S32591A Other specified fracture of right pubis, initial encounter for closed fracture: Secondary | ICD-10-CM | POA: Diagnosis not present

## 2018-01-05 DIAGNOSIS — I10 Essential (primary) hypertension: Secondary | ICD-10-CM

## 2018-01-05 DIAGNOSIS — Z6820 Body mass index (BMI) 20.0-20.9, adult: Secondary | ICD-10-CM

## 2018-01-05 DIAGNOSIS — E46 Unspecified protein-calorie malnutrition: Secondary | ICD-10-CM

## 2018-01-05 DIAGNOSIS — W01198A Fall on same level from slipping, tripping and stumbling with subsequent striking against other object, initial encounter: Secondary | ICD-10-CM

## 2018-01-05 DIAGNOSIS — Z888 Allergy status to other drugs, medicaments and biological substances status: Secondary | ICD-10-CM

## 2018-01-05 DIAGNOSIS — S2241XA Multiple fractures of ribs, right side, initial encounter for closed fracture: Secondary | ICD-10-CM | POA: Diagnosis not present

## 2018-01-05 DIAGNOSIS — M8580 Other specified disorders of bone density and structure, unspecified site: Secondary | ICD-10-CM

## 2018-01-05 DIAGNOSIS — S2241XD Multiple fractures of ribs, right side, subsequent encounter for fracture with routine healing: Secondary | ICD-10-CM | POA: Diagnosis not present

## 2018-01-05 DIAGNOSIS — E785 Hyperlipidemia, unspecified: Secondary | ICD-10-CM

## 2018-01-05 DIAGNOSIS — Z79899 Other long term (current) drug therapy: Secondary | ICD-10-CM

## 2018-01-05 LAB — MAGNESIUM: MAGNESIUM: 1.8 mg/dL (ref 1.7–2.4)

## 2018-01-05 LAB — BASIC METABOLIC PANEL
ANION GAP: 11 (ref 5–15)
BUN: 9 mg/dL (ref 8–23)
CHLORIDE: 98 mmol/L (ref 98–111)
CO2: 27 mmol/L (ref 22–32)
Calcium: 9 mg/dL (ref 8.9–10.3)
Creatinine, Ser: 0.71 mg/dL (ref 0.44–1.00)
GFR calc Af Amer: >60 mL/min (ref >60)
GFR calc non Af Amer: >60 mL/min (ref >60)
Glucose, Bld: 105 mg/dL — ABNORMAL HIGH (ref 70–99)
POTASSIUM: 3 mmol/L — AB (ref 3.5–5.1)
SODIUM: 136 mmol/L (ref 135–145)

## 2018-01-05 MED ORDER — CALCIUM CITRATE 950 (200 CA) MG PO TABS: 200.0000 mg | ORAL_TABLET | Freq: Every day | ORAL | 0 refills | Status: AC

## 2018-01-05 MED ORDER — LOSARTAN POTASSIUM 100 MG PO TABS: 100.0000 mg | ORAL_TABLET | Freq: Every day | ORAL | 1 refills | Status: AC

## 2018-01-05 MED ORDER — HYDROCORTISONE 1 % EX CREA
TOPICAL_CREAM | Freq: Three times a day (TID) | CUTANEOUS | Status: DC | PRN
  Filled 2018-01-05: qty 28.35

## 2018-01-05 MED ORDER — AMLODIPINE BESYLATE 5 MG PO TABS: 5.0000 mg | ORAL_TABLET | Freq: Every day | ORAL | 0 refills | Status: AC

## 2018-01-05 MED ORDER — LIDOCAINE 5 % EX PTCH
1.0000 | MEDICATED_PATCH | CUTANEOUS | Status: DC
  Administered 2018-01-05: 1 via TRANSDERMAL
  Filled 2018-01-05: qty 1

## 2018-01-05 MED ORDER — DICLOFENAC SODIUM 1 % TD GEL
2.0000 g | Freq: Four times a day (QID) | TRANSDERMAL | Status: DC | PRN
  Filled 2018-01-05: qty 100

## 2018-01-05 MED ORDER — DICLOFENAC SODIUM 1 % TD GEL: 2.0000 g | Freq: Four times a day (QID) | TRANSDERMAL | 0 refills | Status: AC | PRN

## 2018-01-05 MED ORDER — POTASSIUM CHLORIDE CRYS ER 20 MEQ PO TBCR
40.0000 meq | EXTENDED_RELEASE_TABLET | Freq: Once | ORAL | Status: AC
  Administered 2018-01-05: 40 meq via ORAL
  Filled 2018-01-05: qty 2

## 2018-01-05 MED ORDER — CHOLECALCIFEROL 25 MCG (1000 UT) PO TABS: 1000.0000 [IU] | ORAL_TABLET | Freq: Every day | ORAL | 0 refills | Status: AC

## 2018-01-05 MED ORDER — POTASSIUM CHLORIDE CRYS ER 20 MEQ PO TBCR
40.0000 meq | EXTENDED_RELEASE_TABLET | Freq: Two times a day (BID) | ORAL | Status: DC
  Administered 2018-01-05: 40 meq via ORAL
  Filled 2018-01-05: qty 2

## 2018-01-05 MED ORDER — LIDOCAINE 5 % EX PTCH: 1.0000 | MEDICATED_PATCH | CUTANEOUS | 0 refills | Status: AC

## 2018-01-05 NOTE — Discharge Instructions (Signed)
Alicia Joseph,   It has been a pleasure working with you and we are glad you're feeling better. You were hospitalized for rib and hip fractures. Surgery has evaluated your images and recommend medical management and pain control at this time. It will take a few weeks for your pain to improve. Please START using voltaren gel or the lidocaine patches on the areas of pain. START taking calcium citrate and Vitamin D daily  We also noticed that you were hypertensive (had high blood pressures) while in the hospital,  Please continue taking the losartan 100 mg daily Please START using amlodipine 5 mg daily  We have sent all of these prescriptions to your pharmacy, the [facility] on [address].  Please call your primary care provider to get a follow up appointment in 1-2 weeks  If your symptoms worsen or you develop new symptoms, please seek medical help whether it is your primary care provider or emergency department.  If you have any questions about this hospitalization please call [phone].

## 2018-01-05 NOTE — Progress Notes (Signed)
   Subjective: Ms. Salley was doing well today, no acute events overnight. She still reports pain in her hip and right lateral chest area, no rashes or bruises noted in those areas. She reports some pain with deep inspiration. We discussed the plan for today and she is in agreement.   Objective:  Vital signs in last 24 hours: Vitals:   01/04/18 2015 01/04/18 2045 01/04/18 2137 01/05/18 0609  BP:  (!) 167/109 (!) 209/88 (!) 183/90  Pulse: 80 75 75 78  Resp: (!) 22 (!) 25 20 19   Temp:   98 F (36.7 C) 97.8 F (36.6 C)  TempSrc:   Oral Oral  SpO2: 100% 99% 97% 96%  Weight:   49.6 kg   Height:   5\' 2"  (1.575 m)     General: Elderly female, NAD, frail Cardiac: RRR, normal S1, S2, no murmurs, rubs or gallops  Pulmonary: Lungs CTA bilaterally, no wheezing, rhonchi or rales Skin: Small area of purple bruise on right upper quadrant of abdomen, no ecchymosis or  Extremity: No LE edema, no muscle atrophy, no lesions or wounds noted  Psychiatry: Normal mood and affect    Assessment/Plan:  Active Problems:   Hypokalemia  This is an 84 year old female with history of hypertension, hyperlipidemia, osteoporosis pia who presented after a fall.  She was found to have nondisplaced fractures of the right posterior 5th-12th ribs and a subacute healing fractures of the right pubic rami.  Evaluated by trauma who recommended supportive care.  She was also found to be hypokalemic and this was replaced with IV and oral potassium.  Right-sided rib fracture after mechanical fall: Trauma has recommended supportive care with pain management for now. She is still having some pain last night. She reports that she would like to go home.  -Vitamin D 1000 units daily and calcium citrate 200 mg daily -Continue with Tylenol PRN -D/c OxyIR, she has not used this while in the hospital -Add volteran gel and lidocaine patch -Continue Incentive spirometry -Contact daughter to discuss if they want to go home or pursue  SNF placement, daughter reports that she will be able to assist the patient at her home and reports that she would like ot go home  Hypokalemia: Potassium was 2.6 on admission.  She has so far received 40 mEq of K-DUR and 20 mEq of IV K.  -Today potassium was 3.0 -Ordered 2 doses of 40 mEq K-dur -Recheck BMP in AM if she is still here  Hypertension: Patient remains hypertensive around 180/90.  She is on at home. -Amlodipine 5 mg daily -Losartan 100 mg daily  FEN: no fluids, replete lytes prn, cardiac diet  VTE ppx: Lovenox  Code Status: FULL   Dispo: Anticipated discharge in approximately today.   Asencion Noble, MD 01/05/2018, 6:36 AM Pager: [phone]

## 2018-01-05 NOTE — Progress Notes (Signed)
Patient's daughter given discharge instructions including written and electronic prescriptions. Daughter verbalized understanding of instructions. Patient taken down to personal vehicle and assisted into the car by nursing staff without incident.

## 2018-01-05 NOTE — Evaluation (Signed)
Physical Therapy Evaluation Patient Details Name: Alicia Joseph MRN: [id number] DOB: [birth year] Today's Date: 01/05/2018   History of Present Illness  This is an 84 year old female with history of hypertension, hyperlipidemia, osteoporosis pia who presented after a fall.  She was found to have nondisplaced fractures of the right posterior 5th-12th ribs and a subacute healing fractures of the right pubic rami.  Evaluated by trauma who recommended supportive care.  She was also found to be hypokalemic and this was replaced with IV and oral potassium.  Clinical Impression  Pt admitted with above diagnosis. Pt currently with functional limitations due to the deficits listed below (see PT Problem List). Pt was able to ambulate with RW without assist.  Pt has 24 hour care.  HHPT recommended.   Pt will benefit from skilled PT to increase their independence and safety with mobility to allow discharge to the venue listed below.      Follow Up Recommendations Home health PT;Supervision/Assistance - 24 hour    Equipment Recommendations  3in1 (PT)    Recommendations for Other Services       Precautions / Restrictions Precautions Precautions: Fall Restrictions Weight Bearing Restrictions: No      Mobility  Bed Mobility Overal bed mobility: Independent                Transfers Overall transfer level: Independent               General transfer comment: incr time due to pain  Ambulation/Gait Ambulation/Gait assistance: Min guard Gait Distance (Feet): 200 Feet Assistive device: Rolling walker (2 wheeled) Gait Pattern/deviations: Step-through pattern;Decreased stride length;Trunk flexed   Gait velocity interpretation: <1.31 ft/sec, indicative of household ambulator General Gait Details: Pt was able to ambulate with min guard assist and occasional steadying assist.  Pt without significant LOB with use of RW.  States her daughter can assist at home.   Stairs             Wheelchair Mobility    Modified Rankin (Stroke Patients Only)       Balance Overall balance assessment: Needs assistance Sitting-balance support: No upper extremity supported;Feet supported Sitting balance-Leahy Scale: Fair     Standing balance support: No upper extremity supported;During functional activity Standing balance-Leahy Scale: Fair Standing balance comment: can stand statically without UE support.                              Pertinent Vitals/Pain Pain Assessment: Faces Faces Pain Scale: Hurts whole lot Pain Location: ribs on right Pain Descriptors / Indicators: Aching;Grimacing;Guarding;Sore Pain Intervention(s): Limited activity within patient's tolerance;Monitored during session;Repositioned    Home Living Family/patient expects to be discharged to:: Private residence Living Arrangements: Alone Available Help at Discharge: Family;Available 24 hours/day(Daughter) Type of Home: House Home Access: Stairs to enter   [employer] of Steps: 2-3 Home Layout: Two level;1/2 bath on main level;Bed/bath upstairs Home Equipment: Walker - 2 wheels;Cane - single point;Hand held shower head      Prior Function Level of Independence: Independent               Hand Dominance   Dominant Hand: Right    Extremity/Trunk Assessment   Upper Extremity Assessment Upper Extremity Assessment: Defer to OT evaluation    Lower Extremity Assessment Lower Extremity Assessment: Generalized weakness    Cervical / Trunk Assessment Cervical / Trunk Assessment: Normal  Communication   Communication: No difficulties  Cognition Arousal/Alertness: Awake/alert  Behavior During Therapy: WFL for tasks assessed/performed Overall Cognitive Status: Within Functional Limits for tasks assessed                                        General Comments      Exercises     Assessment/Plan    PT Assessment Patient needs continued PT  services  PT Problem List Decreased balance;Decreased activity tolerance;Decreased mobility;Decreased knowledge of use of DME;Decreased safety awareness;Decreased knowledge of precautions;Pain       PT Treatment Interventions DME instruction;Gait training;Therapeutic activities;Therapeutic exercise;Stair training;Functional mobility training;Balance training;Neuromuscular re-education;Cognitive remediation;Patient/family education    PT Goals (Current goals can be found in the Care Plan section)  Acute Rehab PT Goals Patient Stated Goal: to go home PT Goal Formulation: With patient Time For Goal Achievement: 01/19/18 Potential to Achieve Goals: Good    Frequency Min 3X/week   Barriers to discharge        Co-evaluation               AM-PAC PT "6 Clicks" Daily Activity  Outcome Measure Difficulty turning over in bed (including adjusting bedclothes, sheets and blankets)?: None Difficulty moving from lying on back to sitting on the side of the bed? : None Difficulty sitting down on and standing up from a chair with arms (e.g., wheelchair, bedside commode, etc,.)?: A Little Help needed moving to and from a bed to chair (including a wheelchair)?: A Little Help needed walking in hospital room?: A Little Help needed climbing 3-5 steps with a railing? : A Little 6 Click Score: 20    End of Session Equipment Utilized During Treatment: Gait belt Activity Tolerance: Patient limited by fatigue Patient left: with call bell/phone within reach;in bed;with bed alarm set Nurse Communication: Mobility status PT Visit Diagnosis: Muscle weakness (generalized) (M62.81);Unsteadiness on feet (R26.81)    Time: 0076-2263 PT Time Calculation (min) (ACUTE ONLY): 20 min   Charges:   PT Evaluation $PT Eval Moderate Complexity: [place] Pager:  [phone]  Office:  [phone]    Denice Paradise 01/05/2018, 12:25 PM

## 2018-01-05 NOTE — Clinical Social Work Note (Signed)
CSW acknowledges SNF consult. PT evaluated today and recommended HHPT. Patient has orders to discharge home today.  CSW signing off.   Dayton Scrape, [place]

## 2018-01-05 NOTE — Care Management Obs Status (Deleted)
[facility] NOTIFICATION   Patient Details  Name: KACEE SUKHU MRN: [id number] Date of Birth: Aug 16, 1929   Medicare Observation Status Notification Given:  (pt unable to sign/ unable to contact daughter)    Royston Bake, RN 01/05/2018, 3:58 PM

## 2018-01-05 NOTE — Evaluation (Signed)
Occupational Therapy Evaluation Patient Details Name: Alicia Joseph MRN: [id number] DOB: [birth year] Today's Date: 01/05/2018    History of Present Illness This is an 84 year old female with history of hypertension, hyperlipidemia, osteoporosis pia who presented after a fall.  She was found to have nondisplaced fractures of the right posterior 5th-12th ribs and a subacute healing fractures of the right pubic rami.  Evaluated by trauma who recommended supportive care.  She was also found to be hypokalemic and this was replaced with IV and oral potassium.   Clinical Impression   This 84 yo female admitted with above presents to acute OT with decreased balance, decreased activity tolerance due to pain, decreased safety awareness all affecting her safety and independence with basic ADLs. Per dter on phone pt lives alone with dtr checking in on her 3-4 times a day. Made dtr aware we are recommending 24/7 care until she feels pt can be left alone. Dtr first reported that she could not do that due to kids and 2 dogs to take are of of then she said she could as long as pt would let her bring dogs over. Pt will continue to benefit from acute OT with follow up Organ (pt currently refusing).    Follow Up Recommendations  Home health OT;Supervision/Assistance - 24 hour;Other (comment)(per CM note pt is refusing [place] services)    Equipment Recommendations  3 in 1 bedside commode       Precautions / Restrictions Precautions Precautions: Fall Restrictions Weight Bearing Restrictions: No      Mobility Bed Mobility Overal bed mobility: Modified Independent             General bed mobility comments: HOB up and use of rail  Transfers Overall transfer level: Needs assistance Equipment used: Rolling walker (2 wheeled) Transfers: Sit to/from Stand Sit to Stand: Min assist         General transfer comment: and increased time due to pain    Balance Overall balance assessment: Needs  assistance Sitting-balance support: No upper extremity supported;Feet supported Sitting balance-Leahy Scale: Fair     Standing balance support: No upper extremity supported;During functional activity Standing balance-Leahy Scale: Fair Standing balance comment: can stand statically without UE support.                            ADL either performed or assessed with clinical judgement   ADL Overall ADL's : Needs assistance/impaired Eating/Feeding: Independent;Sitting   Grooming: Set up;Supervision/safety;Sitting   Upper Body Bathing: Supervision/ safety;Set up;Sitting   Lower Body Bathing: Minimal assistance;Sit to/from stand   Upper Body Dressing : Minimal assistance;Standing   Lower Body Dressing: Minimal assistance;Sit to/from stand   Toilet Transfer: Minimal assistance;Ambulation;RW   Toileting- Clothing Manipulation and Hygiene: Minimal assistance;Sit to/from stand               Vision Patient Visual Report: No change from baseline              Pertinent Vitals/Pain Pain Assessment: 0-10 Pain Score: 10-Worst pain ever(she actually said 101) Faces Pain Scale: Hurts whole lot Pain Location: ribs on right Pain Descriptors / Indicators: Aching;Grimacing;Guarding;Sore(holding her side) Pain Intervention(s): Limited activity within patient's tolerance;Monitored during session;Repositioned     Hand Dominance Right   Extremity/Trunk Assessment Upper Extremity Assessment Upper Extremity Assessment: Generalized weakness           Communication Communication Communication: No difficulties   Cognition Arousal/Alertness: Awake/alert Behavior During Therapy: WFL for  tasks assessed/performed Overall Cognitive Status: No family/caregiver present to determine baseline cognitive functioning Area of Impairment: Orientation;Safety/judgement;Problem solving                 Orientation Level: Time(2029)       Safety/Judgement: Decreased awareness  of safety;Decreased awareness of deficits   Problem Solving: Difficulty sequencing(with walking with RW) General Comments: I explained to patient that I needed to see her get up and walk to the bathroom to see how if she is able to safely do this. Pt got up started towards bathroom (5 steps) and asked if she could sit down and rest. I explained what she was doing again and she said she couldn't make it that far (another 10 steps) so she turned around and went back to bed              Home Living Family/patient expects to be discharged to:: Private residence Living Arrangements: Alone Available Help at Discharge: Family;Available PRN/intermittently Type of Home: House Home Access: Stairs to enter [employer] of Steps: 2-3   Home Layout: Two level;1/2 bath on main level;Bed/bath upstairs Alternate Level Stairs-Number of Steps: flight Alternate Level Stairs-Rails: Left Bathroom Shower/Tub: Occupational psychologist: Standard Bathroom Accessibility: Yes   Home Equipment: Environmental consultant - 2 wheels;Cane - single point;Hand held shower head;Shower seat   Additional Comments: I spoke with dtr over phone and told her we were recommending 24/7 at home with patient until family felt she was safe to be left alone. Dtr said she could only do this if pt would let her bring dogs over. Pt states she has shower seat but has not used it due to fear of falling.      Prior Functioning/Environment Level of Independence: Independent                 OT Problem List: Decreased strength;Decreased range of motion;Decreased activity tolerance;Impaired balance (sitting and/or standing);Pain;Decreased cognition;Decreased safety awareness      OT Treatment/Interventions: Balance training;DME and/or AE instruction;Patient/family education    OT Goals(Current goals can be found in the care plan section) Acute Rehab OT Goals Patient Stated Goal: to go home OT Goal Formulation: With  patient Time For Goal Achievement: 01/19/18 Potential to Achieve Goals: Good  OT Frequency: Min 2X/week              AM-PAC PT "6 Clicks" Daily Activity     Outcome Measure Help from another person eating meals?: None Help from another person taking care of personal grooming?: A Little Help from another person toileting, which includes using toliet, bedpan, or urinal?: A Little Help from another person bathing (including washing, rinsing, drying)?: A Little Help from another person to put on and taking off regular upper body clothing?: A Little Help from another person to put on and taking off regular lower body clothing?: A Little 6 Click Score: 19   End of Session Equipment Utilized During Treatment: Gait belt;Rolling walker  Activity Tolerance: Patient limited by pain Patient left: in bed;with call bell/phone within reach;with bed alarm set  OT Visit Diagnosis: Unsteadiness on feet (R26.81);Other abnormalities of gait and mobility (R26.89);Pain;Other symptoms and signs involving cognitive function Pain - part of body: (right ribs)                Time: 7628-3151 OT Time Calculation (min): 21 min Charges:  OT General Charges $OT Visit: 1 Visit OT Evaluation $OT Eval Moderate Complexity: 1 Mod  [place], OTR/L Acute  [place] Pager [phone] Office [phone]

## 2018-01-05 NOTE — Progress Notes (Signed)
Trauma Service Note  Subjective: Patient very pleasant and in no distress.  IS not at bedside, and I got her one from the clean supply room m  Objective: Vital signs in last 24 hours: Temp:  [97.8 F (36.6 C)-98.7 F (37.1 C)] 97.8 F (36.6 C) (10/21 0609) Pulse Rate:  [70-90] 90 (10/21 0945) Resp:  [15-25] 19 (10/21 0609) BP: (125-211)/(67-109) 146/83 (10/21 0945) SpO2:  [96 %-100 %] 96 % (10/21 0609) Weight:  [49.6 kg] 49.6 kg (10/20 2137) Last BM Date: 01/04/18  Intake/Output from previous day: 10/20 0701 - 10/21 0700 In: 397.5 [P.O.:240; IV Piggyback:157.5] Out: 251 [Urine:251] Intake/Output this shift: Total I/O In: -  Out: 100 [Urine:100]  General: No acute distress  Lungs: Clear with auscultation.  IS up to 1000cc  Abd: Soft, benign  Extremities: No changes  Neuro: Intact  Lab Results: CBC  Recent Labs    01/04/18 1545  WBC 9.1  HGB 13.6  HCT 41.5  PLT 402*   BMET Recent Labs    01/04/18 1545 01/05/18 0635  NA 135 136  K 2.6* 3.0*  CL 97* 98  CO2 27 27  GLUCOSE 96 105*  BUN 11 9  CREATININE 0.64 0.71  CALCIUM 9.3 9.0   PT/INR No results for input(s): LABPROT, INR in the last 72 hours. ABG No results for input(s): PHART, HCO3 in the last 72 hours.  Invalid input(s): PCO2, PO2  Studies/Results: Ct Chest W Contrast  Result Date: 01/04/2018 CLINICAL DATA:  [place] into a wall one week ago. Blunt trauma. Persistent right chest and abdominal pain and tenderness. Initial encounter. EXAM: CT CHEST, ABDOMEN, AND PELVIS WITH CONTRAST TECHNIQUE: Multidetector CT imaging of the chest, abdomen and pelvis was performed following the standard protocol during bolus administration of intravenous contrast. CONTRAST:  158mL OMNIPAQUE IOHEXOL 300 MG/ML  SOLN COMPARISON:  None. FINDINGS: CT CHEST FINDINGS Cardiovascular: No evidence of thoracic aortic injury or mediastinal hematoma. No pericardial effusion. Aortic and coronary artery atherosclerosis.  Mediastinum/Nodes: No masses or pathologically enlarged lymph nodes identified. Lungs/Pleura: No evidence of pulmonary contusion or other infiltrate. No evidence of pneumothorax or hemothorax. Musculoskeletal: Nondisplaced fractures are seen involving the right posterior 5th through 12th ribs. A subacute healing fracture of the body of the sternum is noted which shows periosteal new bone formation. CT ABDOMEN PELVIS FINDINGS Hepatobiliary: No hepatic laceration identified. Small cysts seen in the inferior right hepatic lobe. Prior cholecystectomy. No evidence of biliary obstruction. Pancreas: No parenchymal laceration, mass, or inflammatory changes identified. Spleen: No evidence of splenic laceration. Adrenal/Urinary Tract: No hemorrhage or parenchymal lacerations identified. No evidence of mass or hydronephrosis. Stomach/Bowel: Tiny hiatal hernia noted. Unopacified bowel loops are unremarkable in appearance. No evidence of hemoperitoneum. Diverticulosis is seen mainly involving the sigmoid colon, however there is no evidence of diverticulitis. Vascular/Lymphatic: No evidence of abdominal aortic injury. Aortic atherosclerosis. No pathologically enlarged lymph nodes identified. Reproductive:  No mass or other significant abnormality identified. Other:  None. Musculoskeletal: No acute fractures identified. Subacute to chronic fractures of the right superior and inferior pubic rami noted. IMPRESSION: Acute nondisplaced fractures of the right posterior 5th through 12th ribs. Subacute healing fractures of the sternum and right pubic rami. No evidence of aortic or visceral organ injury. Tiny hiatal hernia. Electronically Signed   By: Earle Gell M.D.   On: 01/04/2018 17:56   Ct Abdomen Pelvis W Contrast  Result Date: 01/04/2018 CLINICAL DATA:  [place] into a wall one week ago. Blunt trauma. Persistent right chest and  abdominal pain and tenderness. Initial encounter. EXAM: CT CHEST, ABDOMEN, AND PELVIS WITH CONTRAST  TECHNIQUE: Multidetector CT imaging of the chest, abdomen and pelvis was performed following the standard protocol during bolus administration of intravenous contrast. CONTRAST:  137mL OMNIPAQUE IOHEXOL 300 MG/ML  SOLN COMPARISON:  None. FINDINGS: CT CHEST FINDINGS Cardiovascular: No evidence of thoracic aortic injury or mediastinal hematoma. No pericardial effusion. Aortic and coronary artery atherosclerosis. Mediastinum/Nodes: No masses or pathologically enlarged lymph nodes identified. Lungs/Pleura: No evidence of pulmonary contusion or other infiltrate. No evidence of pneumothorax or hemothorax. Musculoskeletal: Nondisplaced fractures are seen involving the right posterior 5th through 12th ribs. A subacute healing fracture of the body of the sternum is noted which shows periosteal new bone formation. CT ABDOMEN PELVIS FINDINGS Hepatobiliary: No hepatic laceration identified. Small cysts seen in the inferior right hepatic lobe. Prior cholecystectomy. No evidence of biliary obstruction. Pancreas: No parenchymal laceration, mass, or inflammatory changes identified. Spleen: No evidence of splenic laceration. Adrenal/Urinary Tract: No hemorrhage or parenchymal lacerations identified. No evidence of mass or hydronephrosis. Stomach/Bowel: Tiny hiatal hernia noted. Unopacified bowel loops are unremarkable in appearance. No evidence of hemoperitoneum. Diverticulosis is seen mainly involving the sigmoid colon, however there is no evidence of diverticulitis. Vascular/Lymphatic: No evidence of abdominal aortic injury. Aortic atherosclerosis. No pathologically enlarged lymph nodes identified. Reproductive:  No mass or other significant abnormality identified. Other:  None. Musculoskeletal: No acute fractures identified. Subacute to chronic fractures of the right superior and inferior pubic rami noted. IMPRESSION: Acute nondisplaced fractures of the right posterior 5th through 12th ribs. Subacute healing fractures of the  sternum and right pubic rami. No evidence of aortic or visceral organ injury. Tiny hiatal hernia. Electronically Signed   By: Earle Gell M.D.   On: 01/04/2018 17:56   Dg Chest Portable 1 View  Result Date: 01/04/2018 CLINICAL DATA:  Right rib pain after fall. EXAM: PORTABLE CHEST 1 VIEW COMPARISON:  Radiograph of Jul 27, 2016. FINDINGS: The heart size and mediastinal contours are within normal limits. Both lungs are clear. The visualized skeletal structures are unremarkable. IMPRESSION: No active disease. Electronically Signed   By: Marijo Conception, M.D.   On: 01/04/2018 16:28    Anti-infectives: Anti-infectives (From admission, onward)   None      Assessment/Plan: s/p  Continue to use IS at least 5-10 times per hours.  Discharge when pain is well controlled. She is at low risk for subsequent complications if she continues to do well with IS. We will sign off, but call us if her condition changes for the worse.  LOS: 1 day   Kathryne Eriksson. Dahlia Bailiff, MD, FACS [phone] Trauma Surgeon 01/05/2018

## 2018-01-05 NOTE — Discharge Summary (Signed)
Name: Alicia Joseph MRN: [id number] DOB: 20-Apr-1929 84 y.o. PCP: Patient, No Pcp Per  Date of Admission: 01/04/2018  1:48 PM Date of Discharge: 01/05/18 Attending Physician: Aldine Contes, MD  Discharge Diagnosis: 1.  Right-sided nondisplaced rib fractures 5-12, subacute fractures of sternum and inferior pubic ramus 2.  Hypokalemia 3.  Hypertension  Discharge Medications: Allergies as of 01/05/2018      Reactions   Atorvastatin Other (See Comments)   REACTION: pain in feet   Ezetimibe-simvastatin Other (See Comments)   REACTION: pain in feet (01/04/18 - pt is currently taking ezetimibe without simvastatin)      Medication List    STOP taking these medications   HYDROcodone-acetaminophen 5-325 MG tablet Commonly known as:  NORCO/VICODIN     TAKE these medications   acetaminophen 325 MG tablet Commonly known as:  TYLENOL Take 2 tablets (650 mg total) by mouth every 6 (six) hours as needed for mild pain or moderate pain. What changed:  Another medication with the same name was removed. Continue taking this medication, and follow the directions you see here.   amLODipine 5 MG tablet Commonly known as:  NORVASC Take 1 tablet (5 mg total) by mouth daily. Start taking on:  01/06/2018   ANACIN PO Take 1 tablet by mouth every 6 (six) hours as needed (pain).   calcium citrate 950 MG tablet Commonly known as:  CALCITRATE - dosed in mg elemental calcium Take 1 tablet (200 mg of elemental calcium total) by mouth daily. Start taking on:  01/06/2018   Cholecalciferol 1000 units tablet Take 1 tablet (1,000 Units total) by mouth daily. Start taking on:  01/06/2018   diclofenac sodium 1 % Gel Commonly known as:  VOLTAREN Apply 2 g topically 4 (four) times daily as needed (rib pain).   ezetimibe 10 MG tablet Commonly known as:  ZETIA Take 1 tablet (10 mg total) by mouth daily.   lidocaine 5 % Commonly known as:  LIDODERM Place 1 patch onto the skin daily. Remove &  Discard patch within 12 hours or as directed by MD Start taking on:  01/06/2018   losartan 100 MG tablet Commonly known as:  COZAAR Take 1 tablet (100 mg total) by mouth daily. What changed:  Another medication with the same name was removed. Continue taking this medication, and follow the directions you see here.   polyethylene glycol packet Commonly known as:  MIRALAX / GLYCOLAX Take 17 g by mouth daily as needed for moderate constipation.   senna-docusate 8.6-50 MG tablet Commonly known as:  Senokot-S Take 1 tablet by mouth 2 (two) times daily. For constipation       Disposition and follow-up:   Ms.Alicia Joseph was discharged from [facility] in Stable condition.  At the hospital follow up visit please address:  1.  Please assess pain management, she was discharged with voltaren gel and lidocaine patches.   She was hypertensive on admission, we started her on Amlodipine 5 mg and continued her Losartan 100 mg. Please assess if Amlodipine is necessary.   She was hypokalemic on admission and replaced with IV and oral K, please re-check K.   2.  Labs / imaging needed at time of follow-up: BMP  3.  Pending labs/ test needing follow-up: None  Follow-up Appointments: Follow-up Information    McDiarmid, Blane Ohara, MD. Schedule an appointment as soon as possible for a visit in 10 days.   Specialty:  Family Medicine Why:  call to make  an appointment  Contact information: [place] [id number] [place] by problem list: 1.  Right-sided nondisplaced rib fractures 5-12, subacute fractures of sternum and inferior pubic ramus: Patient presented after a fall 1 week prior to admission, has had significant pain on her right upper chest.  She has had a decrease in her ability to perform her activities of daily living.  Found to have a nondisplaced fractures of the right posterior 5th-12th ribs and subacute healing  fractures of sternum and right pubic superior and inferior rami.  She was evaluated by trauma the ED who recommended supportive care and stated that she will likely need pain control for up to 6 weeks, and incentive spirometry.  Pain was managed with Tylenol and lidocaine and Voltaren gel.  She had oxycodone available however did not use this.  PT and OT evaluated and recommended home health PT.  Daughter and patient reported that to go home and not to a facility.  Discharged home with lidocaine patches, Voltaren gel, Tylenol and calcium and vitamin D.  2.  Hypokalemia: Patient was found to have a potassium of 2.6 on admission.  Thought to be secondary to decreased oral intake.  Replaced with IV and p.o. potassium.  Potassium improved to 3 and patient was given 80 mEq of K-Dur prior to discharge.  3.  Hypertension: Patient is supposed to be on losartan at home, it is unclear if she had been taking it.  Losartan 100 mg was continued in the hospital.  Amlodipine 5 mg was added.  Blood pressure remained normotensive.  She was discharged on losartan and amlodipine. Follow-up with her PCP.  Discharge Vitals:   BP (!) 146/83 (BP Location: Left Arm)   Pulse 90   Temp 97.8 F (36.6 C) (Oral)   Resp 19   Ht 5\' 2"  (1.575 m)   Wt 49.6 kg Comment: Scale C  SpO2 96%   BMI 20.01 kg/m   Pertinent Labs, Studies, and Procedures:  CBC Latest Ref Rng & Units 01/04/2018 05/13/2017 04/17/2017  WBC 4.0 - 10.5 K/uL 9.1 9.9 8.9  Hemoglobin 12.0 - 15.0 g/dL 13.6 11.6(L) 11.8(L)  Hematocrit 36.0 - 46.0 % 41.5 35.4(L) 35.8(L)  Platelets 150 - 400 K/uL 402(H) 281 291   BMP Latest Ref Rng & Units 01/05/2018 01/04/2018 05/13/2017  Glucose 70 - 99 mg/dL 105(H) 96 98  BUN 8 - 23 mg/dL 9 11 11   Creatinine 0.44 - 1.00 mg/dL 0.71 0.64 0.58  Sodium 135 - 145 mmol/L 136 135 133(L)  Potassium 3.5 - 5.1 mmol/L 3.0(L) 2.6(LL) 3.6  Chloride 98 - 111 mmol/L 98 97(L) 97(L)  CO2 22 - 32 mmol/L 27 27 25   Calcium 8.9 - 10.3 mg/dL  9.0 9.3 9.0   PORTABLE CHEST 1 VIEW IMPRESSION: No active disease.  CT CHEST, ABDOMEN, AND PELVIS WITH CONTRAST IMPRESSION: Acute nondisplaced fractures of the right posterior 5th through 12th ribs.  Subacute healing fractures of the sternum and right pubic rami.  No evidence of aortic or visceral organ injury.  Tiny hiatal hernia.  Discharge Instructions: Discharge Instructions    Call MD for:  difficulty breathing, headache or visual disturbances   Complete by:  As directed    Call MD for:  extreme fatigue   Complete by:  As directed    Call MD for:  hives   Complete by:  As directed    Call MD for:  persistant  dizziness or light-headedness   Complete by:  As directed    Call MD for:  persistant nausea and vomiting   Complete by:  As directed    Call MD for:  redness, tenderness, or signs of infection (pain, swelling, redness, odor or green/yellow discharge around incision site)   Complete by:  As directed    Call MD for:  severe uncontrolled pain   Complete by:  As directed    Call MD for:  temperature >100.4   Complete by:  As directed    Diet - low sodium heart healthy   Complete by:  As directed    Discharge instructions   Complete by:  As directed    Joretta Bachelor,   It has been a pleasure working with you and we are glad you're feeling better. You were hospitalized for rib and hip fractures. Surgery has evaluated your images and recommend medical management and pain control at this time. It will take a few weeks for your pain to improve. Please START using voltaren gel or the lidocaine patches on the areas of pain. START taking calcium citrate and Vitamin D daily  We also noticed that you were hypertensive (had high blood pressures) while in the hospital,  Please continue taking the losartan 100 mg daily Please START using amlodipine 5 mg daily  We have sent all of these prescriptions to your pharmacy.   Please call your primary care provider to get a  follow up appointment in 1-2 weeks  If your symptoms worsen or you develop new symptoms, please seek medical help whether it is your primary care provider or emergency department.  If you have any questions about this hospitalization please call [phone].   Increase activity slowly   Complete by:  As directed       Signed: Asencion Noble, MD 01/05/2018, 12:52 PM   Pager: [phone]

## 2018-01-05 NOTE — Progress Notes (Signed)
Physical Therapy Treatment Patient Details Name: Alicia Joseph MRN: [id number] DOB: 1929/10/29 Today's Date: 01/05/2018    History of Present Illness This is an 84 year old female with history of hypertension, hyperlipidemia, osteoporosis pia who presented after a fall.  She was found to have nondisplaced fractures of the right posterior 5th-12th ribs and a subacute healing fractures of the right pubic rami.  Evaluated by trauma who recommended supportive care.  She was also found to be hypokalemic and this was replaced with IV and oral potassium.    PT Comments    Pt admitted with above diagnosis. Pt currently with functional limitations due to balaance and endurance deficits. Pt was up in room on arrival.  Assisted pt to the bathroom and back to bed with alarm in place.  Nurse made aware.  Pt appears to be confused this pm.  Pt will benefit from skilled PT to increase their independence and safety with mobility to allow discharge to the venue listed below.     Follow Up Recommendations  Home health PT;Supervision/Assistance - 24 hour     Equipment Recommendations  3in1 (PT)    Recommendations for Other Services       Precautions / Restrictions Precautions Precautions: Fall Restrictions Weight Bearing Restrictions: No    Mobility  Bed Mobility Overal bed mobility: Independent                Transfers Overall transfer level: Independent               General transfer comment: incr time due to pain  Ambulation/Gait Ambulation/Gait assistance: Min guard Gait Distance (Feet): 40 Feet Assistive device: None Gait Pattern/deviations: Step-through pattern;Decreased stride length;Trunk flexed   Gait velocity interpretation: <1.31 ft/sec, indicative of household ambulator General Gait Details: Pt was able to ambulate with min guard assist and occasional steadying assist to bathroom.  Pt had climbed OOB and alarm was going off on arrival and PT found pt at closet and  pt stated she was looking for her daughter.  Assisted pt to sitting and she stated she needed to use bathroom.  Took pt to bathrooma nd assisted her back to bed.  Nurse aware that pt got up without calling.     Stairs             Wheelchair Mobility    Modified Rankin (Stroke Patients Only)       Balance Overall balance assessment: Needs assistance Sitting-balance support: No upper extremity supported;Feet supported Sitting balance-Leahy Scale: Fair     Standing balance support: No upper extremity supported;During functional activity Standing balance-Leahy Scale: Fair Standing balance comment: can stand statically without UE support.                             Cognition Arousal/Alertness: Awake/alert Behavior During Therapy: WFL for tasks assessed/performed Overall Cognitive Status: Within Functional Limits for tasks assessed                                        Exercises      General Comments        Pertinent Vitals/Pain Pain Assessment: Faces Faces Pain Scale: Hurts whole lot Pain Location: ribs on right Pain Descriptors / Indicators: Aching;Grimacing;Guarding;Sore Pain Intervention(s): Limited activity within patient's tolerance;Monitored during session;Repositioned    Home Living Family/patient expects to be discharged to:: Private residence  Living Arrangements: Alone Available Help at Discharge: Family;Available 24 hours/day(Daughter) Type of Home: House Home Access: Stairs to enter   Home Layout: Two level;1/2 bath on main level;Bed/bath upstairs Home Equipment: Walker - 2 wheels;Cane - single point;Hand held shower head      Prior Function Level of Independence: Independent          PT Goals (current goals can now be found in the care plan section) Acute Rehab PT Goals Patient Stated Goal: to go home PT Goal Formulation: With patient Time For Goal Achievement: 01/19/18 Potential to Achieve Goals: Good Progress  towards PT goals: Progressing toward goals    Frequency    Min 3X/week      PT Plan Current plan remains appropriate    Co-evaluation              AM-PAC PT "6 Clicks" Daily Activity  Outcome Measure  Difficulty turning over in bed (including adjusting bedclothes, sheets and blankets)?: None Difficulty moving from lying on back to sitting on the side of the bed? : None Difficulty sitting down on and standing up from a chair with arms (e.g., wheelchair, bedside commode, etc,.)?: A Little Help needed moving to and from a bed to chair (including a wheelchair)?: A Little Help needed walking in hospital room?: A Little Help needed climbing 3-5 steps with a railing? : A Little 6 Click Score: 20    End of Session Equipment Utilized During Treatment: Gait belt Activity Tolerance: Patient limited by fatigue Patient left: with call bell/phone within reach;in bed;with bed alarm set Nurse Communication: Mobility status PT Visit Diagnosis: Muscle weakness (generalized) (M62.81);Unsteadiness on feet (R26.81)     Time: 2248-2500 PT Time Calculation (min) (ACUTE ONLY): 15 min  Charges:  $Gait Training: 8-22 mins                     [place] Pager:  [phone]  Office:  [place] 01/05/2018, 2:26 PM

## 2018-01-05 NOTE — Care Management Note (Addendum)
Case Management Note  Patient Details  Name: Alicia Joseph MRN: [id number] Date of Birth: September 18, 1929  Subjective/Objective:      Hypokalemia             Action/Plan: Patient lives at home alone; daughter lives close by and assist as needed; she has a new PCP, does not know the name; has private insurance with [facility] with prescription drug coverage; CM offered [place] choice, patient refused all HHC services at this time; CM informed pt that if she changed her mind her PCP can make the arrangements for [facility] from the office. B Hser Belanger RN,MHA,BSN  1:45pm - Patient is confused, unable to get code 11 signed; awaiting for daughter to arrive. VM left awaiting callback. Mindi Slicker [facility]  Expected Discharge Date:  01/05/18               Expected Discharge Plan:  [place]  Discharge planning Services  CM Consult  Choice offered to:  Patient  HH Arranged:  Patient Refused [facility]  Status of Service:  In process, will continue to follow  Sherrilyn Rist [phone] 01/05/2018, 12:58 PM

## 2018-01-05 NOTE — Care Management Obs Status (Signed)
[place] NOTIFICATION   Patient Details  Name: CYNTHYA YAM MRN: [id number] Date of Birth: Nov 05, 1929   Medicare Observation Status Notification Given:  (pt unable to sign/ unable to contact daughter)    Royston Bake, RN 01/05/2018, 3:58 PM

## 2018-01-05 NOTE — Care Management CC44 (Signed)
Condition Code 44 Documentation Completed  Patient Details  Name: Alicia Joseph MRN: [id number] Date of Birth: [birth year]   Condition Code 44 given:  (pt unable to sign/ unable to contact daughter) Patient signature on Condition Code 44 notice:   pt unable to sign/confused/ unable to contact daughter  Documentation of 2 MD's agreement:   yes Code 44 added to claim:   yes    Royston Bake, RN 01/05/2018, 3:59 PM

## 2018-01-05 NOTE — Progress Notes (Signed)
Dr. Truman Hayward visited with patient. Patient still refusing treatment and telemetry. Will continue to monitor patient.

## 2018-01-31 DIAGNOSIS — Z743 Need for continuous supervision: Secondary | ICD-10-CM | POA: Diagnosis not present

## 2018-02-15 DIAGNOSIS — 419620001 Death: Secondary | SNOMED CT | POA: Diagnosis not present

## 2018-02-15 DEATH — deceased

## 2018-04-30 IMAGING — CR DG CHEST 1V
1 series · 1 of 1 positions shown · non-contrast
Comparison: Chest x-ray dated 05/13/2016.

CLINICAL DATA: Patient fell this AM, left sided pain. Primary pain
in left shoulder.

EXAM:
CHEST 1 VIEW

[chest ap]
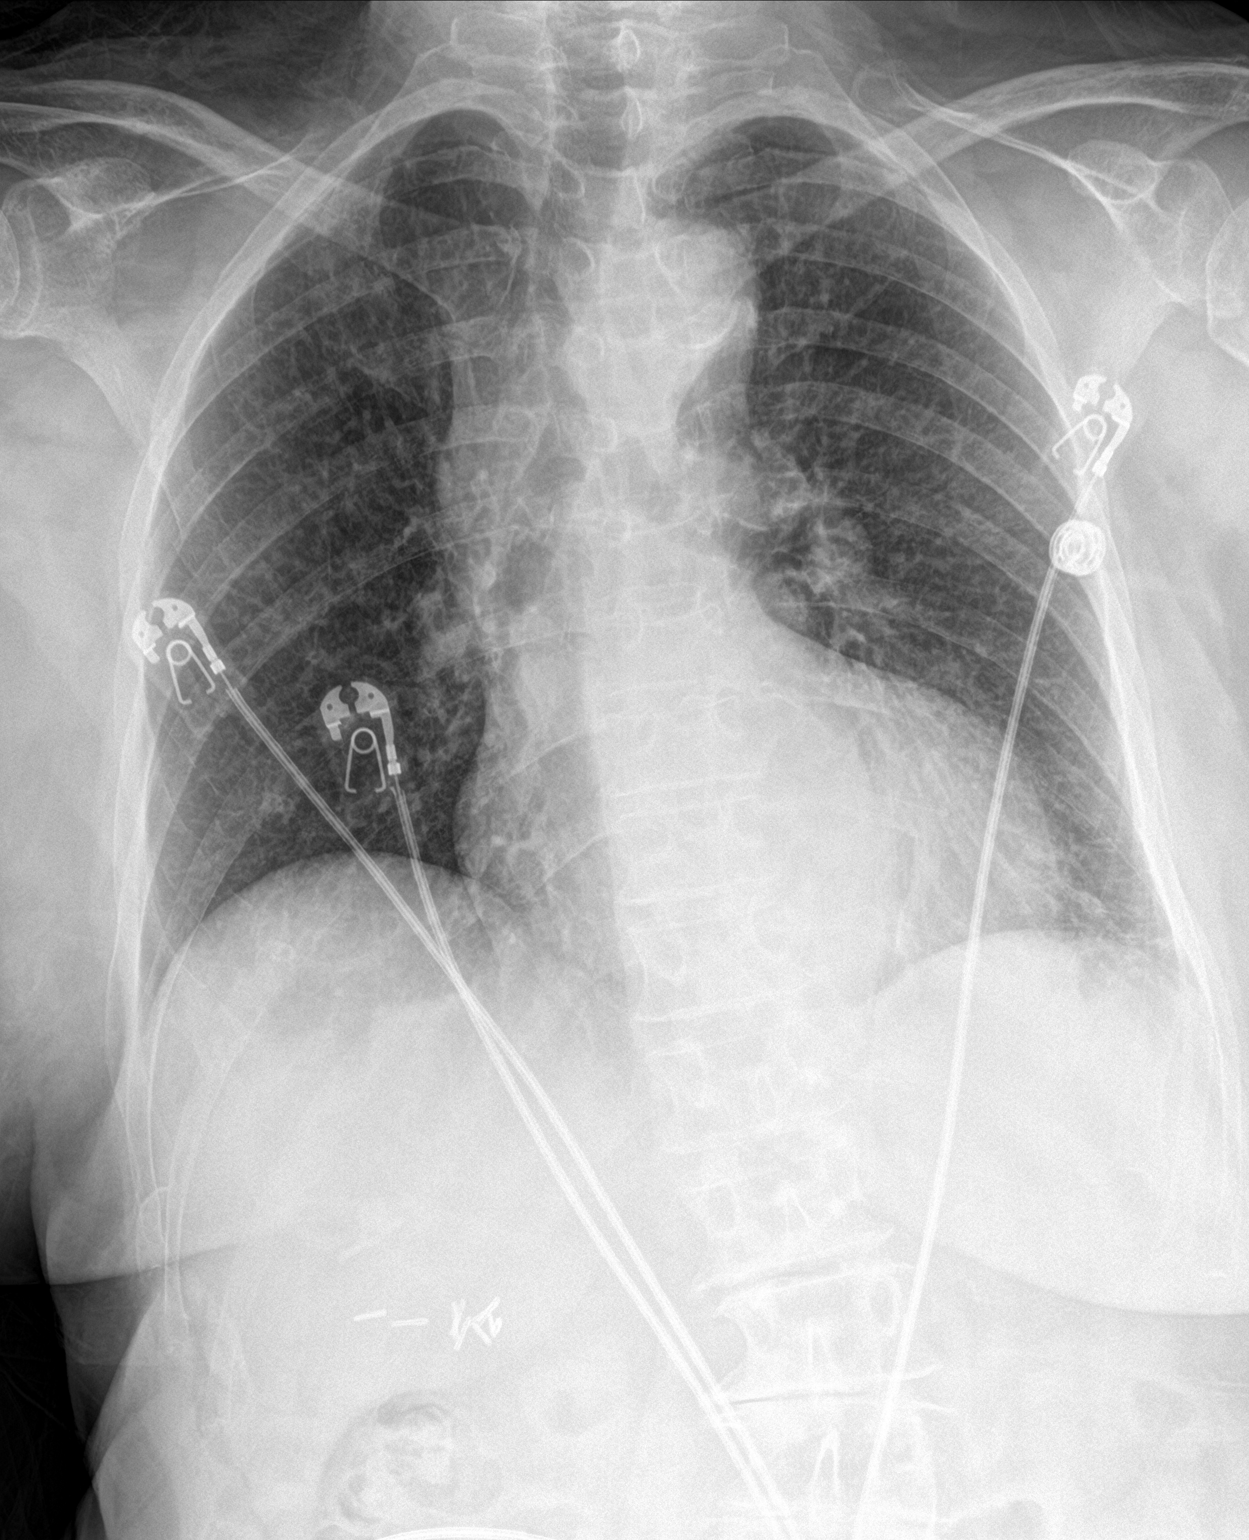

[1 of 1 positions shown; findings below may reference images not displayed]

FINDINGS: Heart size and mediastinal contours are stable. Atherosclerotic
changes noted aortic arch. Coarse lung markings bilaterally suggests
some degree of chronic interstitial fibrosis. No pleural effusion or
pneumothorax seen.

Again noted is scoliotic deformity of the thoracolumbar spine, with
associated mild degenerative change. No osseous fracture or
dislocation seen. The left humeral neck fracture, appreciated on
other plain films from today, is not imaged on this exam.
IMPRESSION: No active disease. No evidence of pneumonia or pulmonary edema.
Probable chronic interstitial lung disease.

Aortic atherosclerosis.

## 2018-04-30 IMAGING — CR DG FEMUR 2+V*L*
5 series · 5 of 5 positions shown · non-contrast
Comparison: None.

CLINICAL DATA: Patient fell this AM, left sided pain. Primary pain
in left shoulder.

EXAM:
LEFT FEMUR 2 VIEWS

[femur ap (1 of 2)]
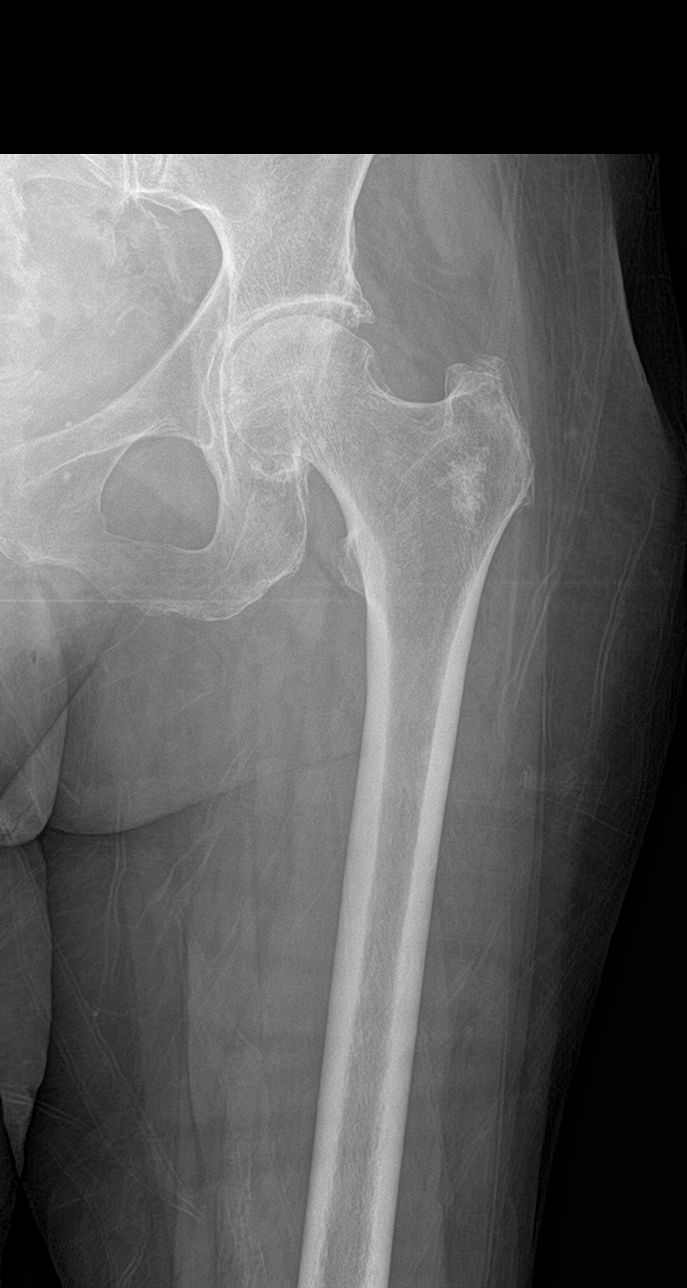

[femur lat (1 of 3)]
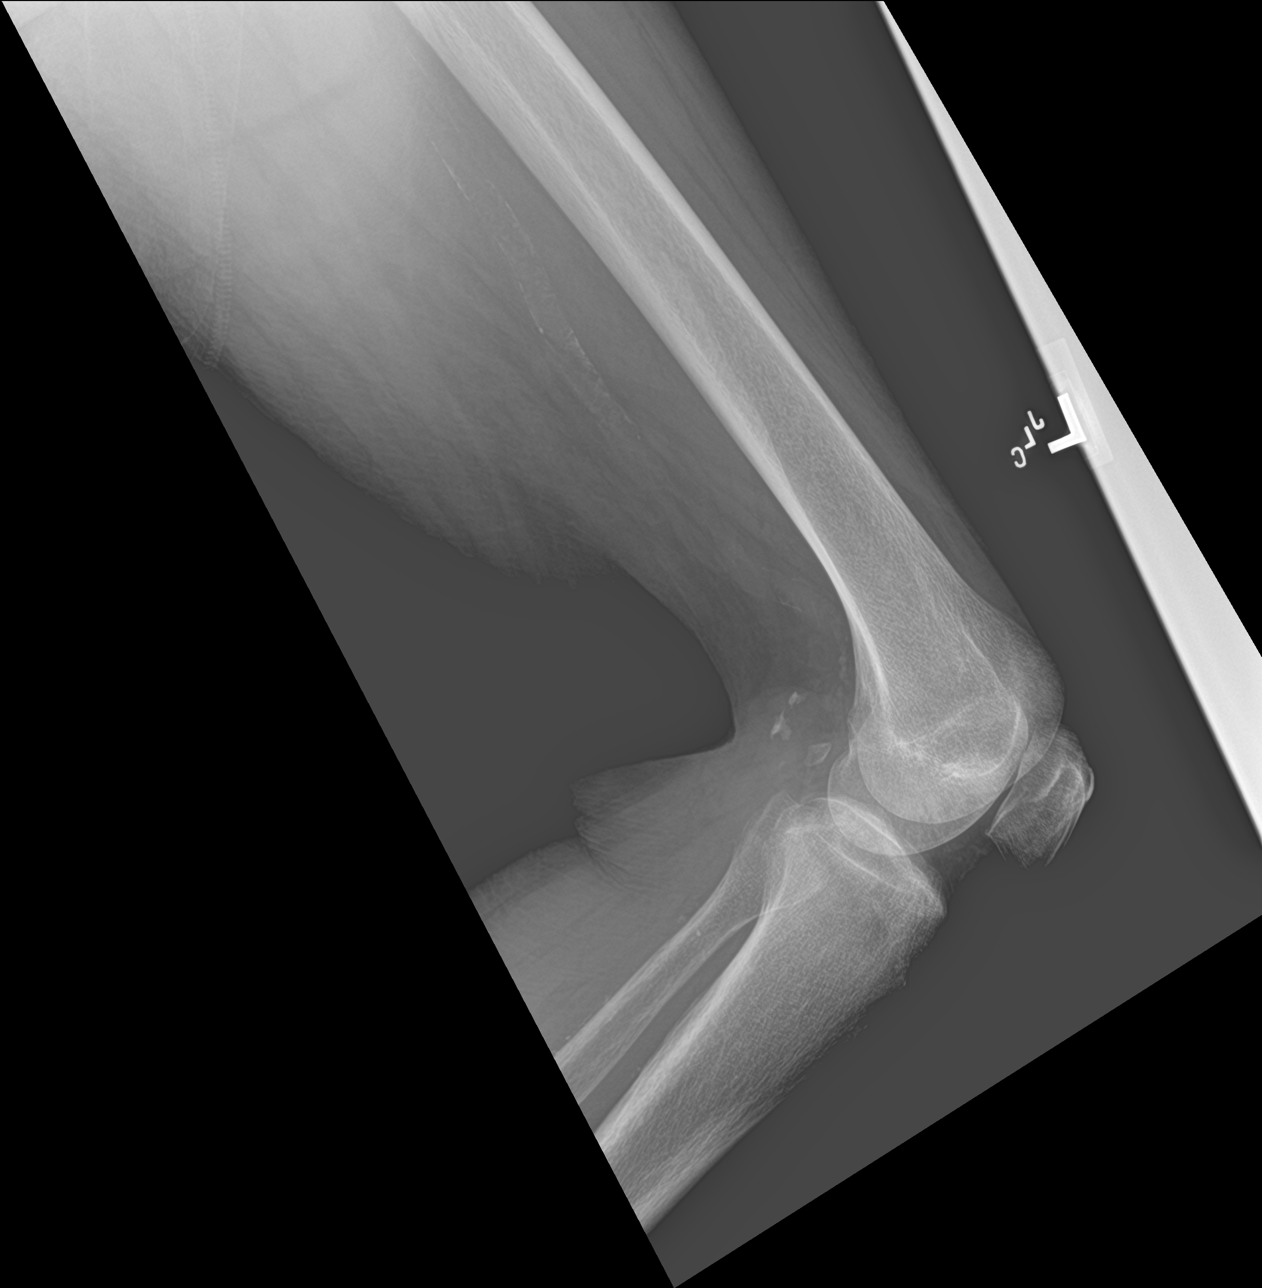

[femur lat (2 of 3)]
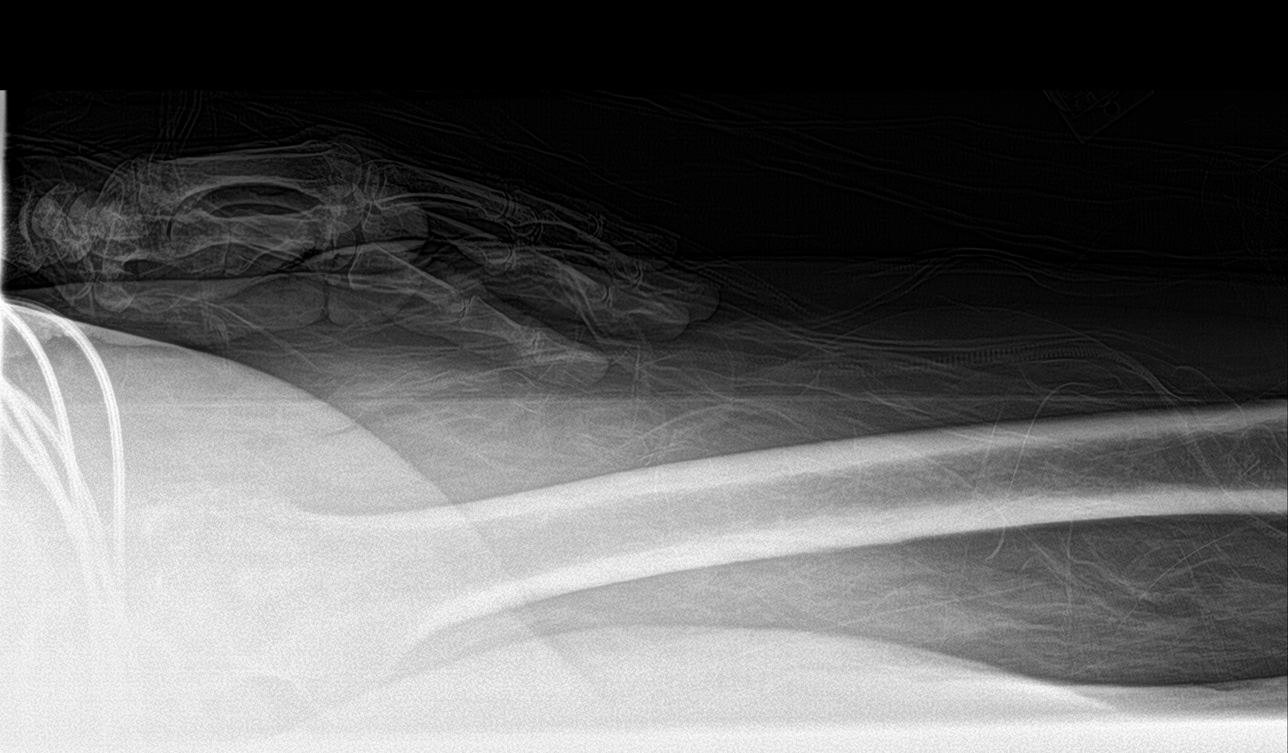

[femur ap (2 of 2)]
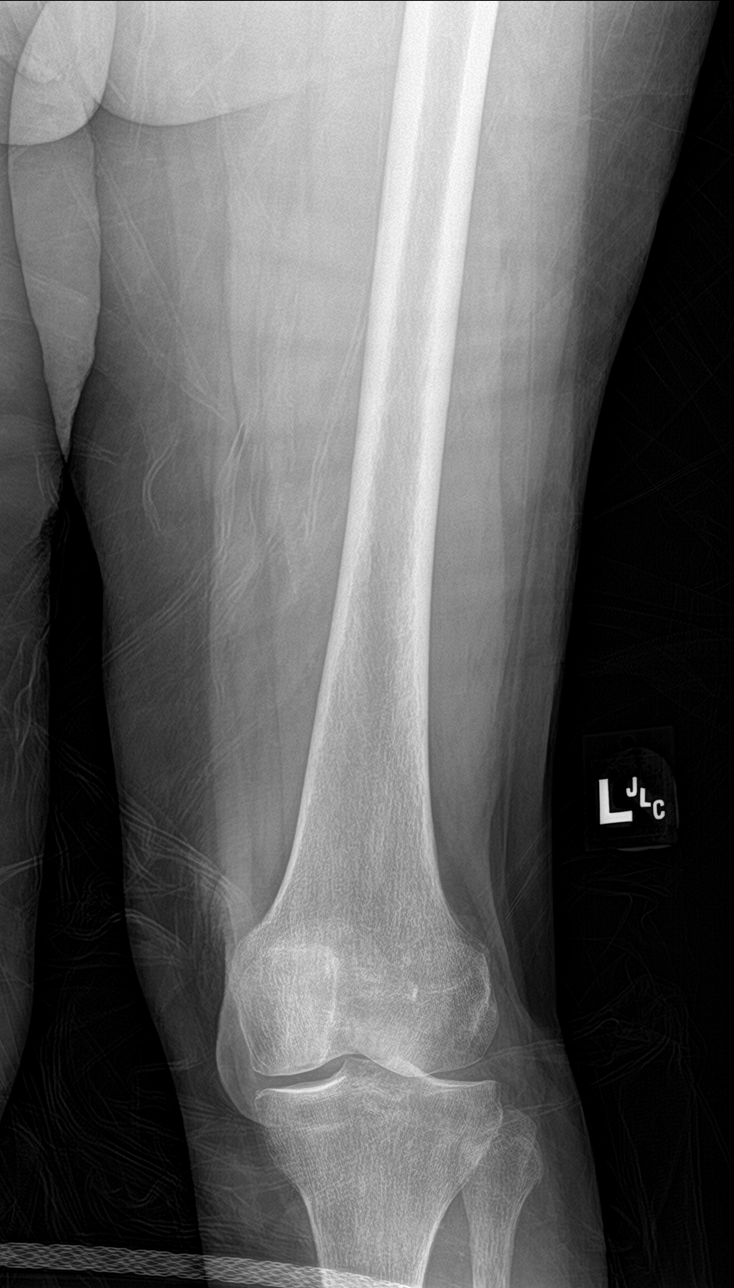

[femur lat (3 of 3)]
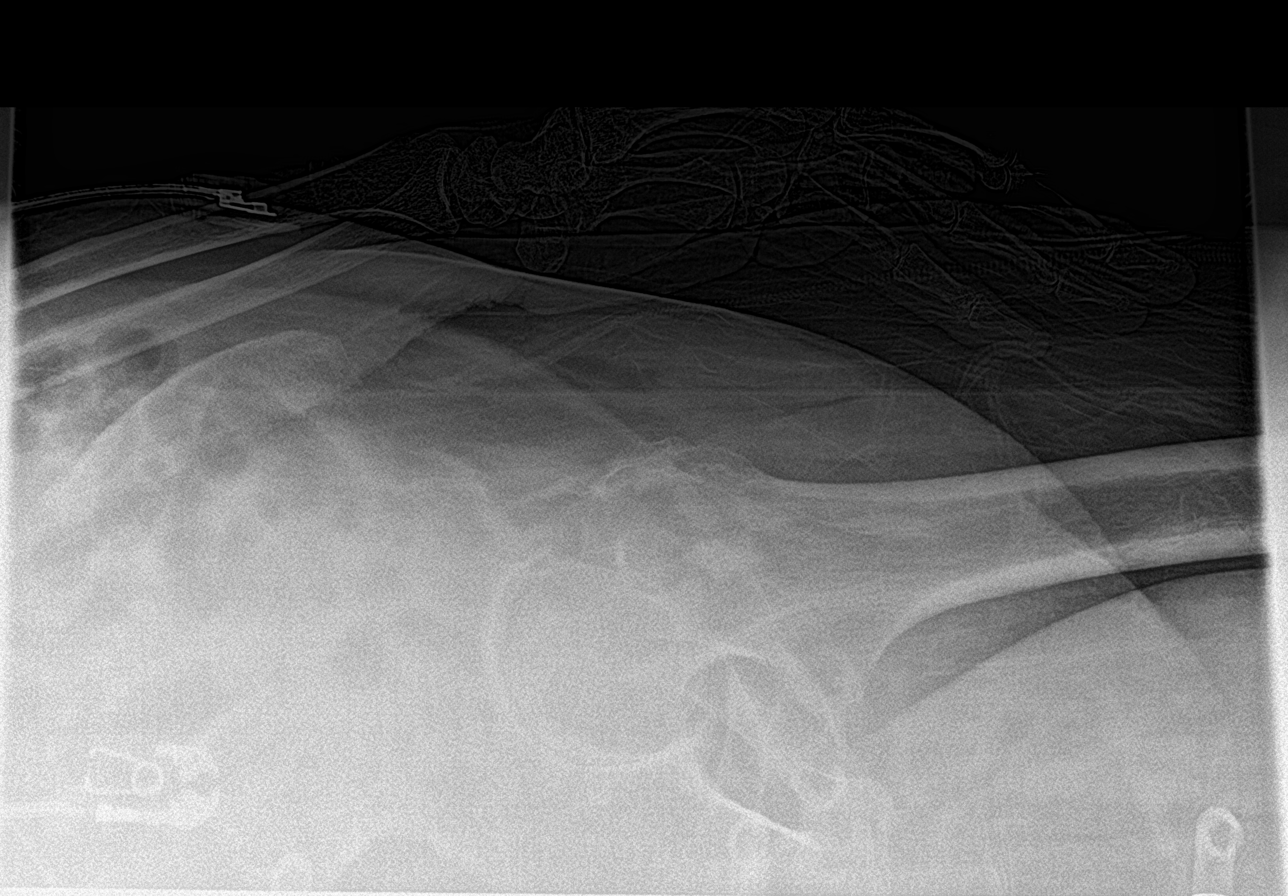

[5 of 5 positions shown; findings below may reference images not displayed]

FINDINGS: There is no evidence of fracture or other focal bone lesions. Soft
tissues are unremarkable. Atherosclerosis of the femoral and
popliteal artery.
IMPRESSION: No acute findings. No osseous fracture or dislocation.
Atherosclerosis of the femoral and popliteal arteries.
